# Patient Record
Sex: Female | Born: 1950 | Race: White | Hispanic: No | Smoking: Former smoker
Health system: Southern US, Community
[De-identification: ages and names within clinical notes are randomized; demographics above are authoritative.]

## PROBLEM LIST (undated history)

## (undated) DIAGNOSIS — K219 Gastro-esophageal reflux disease without esophagitis: Secondary | ICD-10-CM

## (undated) DIAGNOSIS — N3946 Mixed incontinence: Secondary | ICD-10-CM

## (undated) DIAGNOSIS — G8929 Other chronic pain: Secondary | ICD-10-CM

## (undated) DIAGNOSIS — M810 Age-related osteoporosis without current pathological fracture: Secondary | ICD-10-CM

## (undated) DIAGNOSIS — E785 Hyperlipidemia, unspecified: Secondary | ICD-10-CM

## (undated) DIAGNOSIS — K635 Polyp of colon: Secondary | ICD-10-CM

## (undated) DIAGNOSIS — M549 Dorsalgia, unspecified: Secondary | ICD-10-CM

## (undated) DIAGNOSIS — F419 Anxiety disorder, unspecified: Secondary | ICD-10-CM

## (undated) DIAGNOSIS — I10 Essential (primary) hypertension: Secondary | ICD-10-CM

## (undated) HISTORY — DX: Other chronic pain: G89.29

## (undated) HISTORY — DX: Essential (primary) hypertension: I10

## (undated) HISTORY — DX: Hyperlipidemia, unspecified: E78.5

## (undated) HISTORY — DX: Mixed incontinence: N39.46

## (undated) HISTORY — DX: Dorsalgia, unspecified: M54.9

## (undated) HISTORY — PX: OTHER SURGICAL HISTORY: SHX169

## (undated) HISTORY — DX: Polyp of colon: K63.5

## (undated) HISTORY — DX: Age-related osteoporosis without current pathological fracture: M81.0

## (undated) HISTORY — PX: FOOT SURGERY: SHX648

## (undated) HISTORY — PX: FOOT ARTHRODESIS, MODIFIED MCBRIDE: SUR52

## (undated) HISTORY — PX: BREAST BIOPSY: SHX20

## (undated) HISTORY — DX: Gastro-esophageal reflux disease without esophagitis: K21.9

## (undated) HISTORY — DX: Anxiety disorder, unspecified: F41.9

## (undated) HISTORY — PX: HYSTEROSCOPY WITH D & C: SHX1775

---

## 1999-10-24 HISTORY — PX: NISSEN FUNDOPLICATION: SHX2091

## 2000-10-23 HISTORY — PX: CHOLECYSTECTOMY: SHX55

## 2004-09-16 ENCOUNTER — Other Ambulatory Visit: Payer: Self-pay

## 2004-09-23 ENCOUNTER — Ambulatory Visit: Payer: Self-pay | Admitting: Unknown Physician Specialty

## 2004-10-09 ENCOUNTER — Emergency Department: Payer: Self-pay | Admitting: Internal Medicine

## 2004-10-23 HISTORY — PX: WRIST SURGERY: SHX841

## 2004-11-09 ENCOUNTER — Ambulatory Visit: Payer: Self-pay | Admitting: Obstetrics and Gynecology

## 2005-11-21 ENCOUNTER — Ambulatory Visit: Payer: Self-pay | Admitting: Obstetrics and Gynecology

## 2005-12-04 ENCOUNTER — Ambulatory Visit: Payer: Self-pay | Admitting: Obstetrics and Gynecology

## 2007-01-29 ENCOUNTER — Ambulatory Visit: Payer: Self-pay | Admitting: Obstetrics and Gynecology

## 2007-06-28 ENCOUNTER — Ambulatory Visit: Payer: Self-pay | Admitting: Unknown Physician Specialty

## 2008-02-03 ENCOUNTER — Ambulatory Visit: Payer: Self-pay | Admitting: Obstetrics and Gynecology

## 2009-02-09 ENCOUNTER — Ambulatory Visit: Payer: Self-pay | Admitting: Unknown Physician Specialty

## 2010-02-21 ENCOUNTER — Ambulatory Visit: Payer: Self-pay | Admitting: Internal Medicine

## 2011-03-07 ENCOUNTER — Ambulatory Visit: Payer: Self-pay | Admitting: Obstetrics and Gynecology

## 2012-01-23 LAB — HM PAP SMEAR: HM Pap smear: NORMAL

## 2012-03-07 ENCOUNTER — Ambulatory Visit: Payer: Self-pay | Admitting: Obstetrics and Gynecology

## 2012-08-13 ENCOUNTER — Ambulatory Visit: Payer: Self-pay | Admitting: Unknown Physician Specialty

## 2012-08-21 ENCOUNTER — Ambulatory Visit: Payer: Self-pay | Admitting: Unknown Physician Specialty

## 2012-08-21 LAB — HM COLONOSCOPY

## 2012-08-23 LAB — PATHOLOGY REPORT

## 2013-05-12 ENCOUNTER — Ambulatory Visit: Payer: Self-pay | Admitting: Internal Medicine

## 2014-01-05 ENCOUNTER — Ambulatory Visit: Payer: Self-pay | Admitting: Unknown Physician Specialty

## 2014-01-06 LAB — LIPID PANEL
Cholesterol: 226 mg/dL — AB (ref 0–200)
HDL: 61 mg/dL (ref 35–70)
LDL Cholesterol: 108 mg/dL
Triglycerides: 283 mg/dL — AB (ref 40–160)

## 2014-01-07 ENCOUNTER — Ambulatory Visit: Payer: Self-pay | Admitting: Orthopedic Surgery

## 2014-05-08 LAB — BASIC METABOLIC PANEL
BUN: 13 mg/dL (ref 4–21)
Creatinine: 0.6 mg/dL (ref 0.5–1.1)
Glucose: 74 mg/dL
Potassium: 4 mmol/L (ref 3.4–5.3)
Sodium: 140 mmol/L (ref 137–147)

## 2014-05-08 LAB — HEPATIC FUNCTION PANEL
ALT: 18 U/L (ref 7–35)
AST: 23 U/L (ref 13–35)
Alkaline Phosphatase: 87 U/L (ref 25–125)
Bilirubin, Total: 0.2 mg/dL

## 2014-06-10 ENCOUNTER — Ambulatory Visit: Payer: Self-pay | Admitting: Obstetrics and Gynecology

## 2014-06-10 LAB — HM MAMMOGRAPHY: HM Mammogram: NEGATIVE

## 2014-11-13 ENCOUNTER — Ambulatory Visit: Payer: Self-pay | Admitting: Nurse Practitioner

## 2014-11-23 ENCOUNTER — Encounter: Payer: Self-pay | Admitting: Nurse Practitioner

## 2014-11-23 ENCOUNTER — Ambulatory Visit (INDEPENDENT_AMBULATORY_CARE_PROVIDER_SITE_OTHER): Payer: 59 | Admitting: Nurse Practitioner

## 2014-11-23 ENCOUNTER — Encounter: Payer: Self-pay | Admitting: *Deleted

## 2014-11-23 ENCOUNTER — Encounter (INDEPENDENT_AMBULATORY_CARE_PROVIDER_SITE_OTHER): Payer: Self-pay

## 2014-11-23 VITALS — BP 156/82 | HR 81 | Temp 97.5°F | Resp 14 | Ht 60.0 in | Wt 124.4 lb

## 2014-11-23 DIAGNOSIS — Z7189 Other specified counseling: Secondary | ICD-10-CM

## 2014-11-23 DIAGNOSIS — I10 Essential (primary) hypertension: Secondary | ICD-10-CM

## 2014-11-23 DIAGNOSIS — Z7689 Persons encountering health services in other specified circumstances: Secondary | ICD-10-CM

## 2014-11-23 MED ORDER — LISINOPRIL-HYDROCHLOROTHIAZIDE 20-12.5 MG PO TABS: 1.0000 | ORAL_TABLET | Freq: Every day | ORAL | Status: DC

## 2014-11-23 NOTE — Patient Instructions (Addendum)
Stop the losartan and start the Lisinopril-HCTZ combo. Call us if it makes your BP below 100/60 or it stays above 150/90. We will follow up in 2 weeks for your BP and cholesterol.

## 2014-11-23 NOTE — Progress Notes (Signed)
Pre visit review using our clinic review tool, if applicable. No additional management support is needed unless otherwise documented below in the visit note. 

## 2014-11-23 NOTE — Progress Notes (Signed)
Subjective:    Patient ID: Margaret Fernandez, female    DOB: 12-06-1950, 64 y.o.   MRN: [id number]  HPI  Margaret Fernandez is a 64 yo female here to establish care.   1) Health Maintenance-   Diet- Cooks often, eats healthy alternatives   Exercise- Before work and sometimes after work, x 3-4 days, 30-45 min.   Immunizations- Up to date on flu, and tdap  Mammogram- 2015 April   Pap- Due this year  Bone Density- 2013   Colonoscopy- within 5 years  Eye Exam- May or June this year due  Dental Exam- Up to date   2) Chronic Problems-  HTN- Not controlled  Cholesterol- On lovastatin 40 mg  3) Acute Problems- Lost dog last year- stressful  HTN- Does not like losartan and would like a diuretic again. Also, she is concerned about her cholesterol still being high on Mevacor 40 mg.   Review of Systems  Constitutional: Negative for fever, chills, diaphoresis and fatigue.  Respiratory: Negative for chest tightness, shortness of breath and wheezing.   Cardiovascular: Negative for chest pain, palpitations and leg swelling.  Gastrointestinal: Negative for nausea, vomiting, abdominal pain, diarrhea and abdominal distention.  Skin: Negative for rash.  Neurological: Negative for dizziness, weakness, numbness and headaches.  Psychiatric/Behavioral: The patient is not nervous/anxious.    Past Medical History  Diagnosis Date  . GERD (gastroesophageal reflux disease)   . Hypertension   . Anxiety   . Hyperlipidemia   . Chronic back pain   . Osteoporosis   . Colon polyp     History   Social History  . Marital Status: Divorced    Spouse Name: N/A    Number of Children: N/A  . Years of Education: N/A   Occupational History  . Not on file.   Social History Main Topics  . Smoking status: Former Smoker    Quit date: 10/23/2013  . Smokeless tobacco: Never Used  . Alcohol Use: 0.6 oz/week    1 Glasses of wine per week  . Drug Use: No  . Sexual Activity: Not Currently   Other Topics Concern    . Not on file   Social History Narrative   Works at [employer]- Works on [employer] by herself   2 Cats live inside   [place] level of education- GED   From [place], moved here in 1971        Past Surgical History  Procedure Laterality Date  . Hysteroscopy w/d&c    . Foot surgery    . Nissen fundoplication  6546  . Cholecystectomy  2002  . Wrist surgery  2006  . Bone graph      left wrist  . Foot arthrodesis, modified mcbride      Both feet    Family History  Problem Relation Age of Onset  . Cancer Mother     ovarian cancer  . Hypertension Mother   . Heart disease Mother   . Cancer Father     colon cancer    Allergies  Allergen Reactions  . Penicillins Itching and Swelling  . Augmentin [Amoxicillin-Pot Clavulanate] Itching and Swelling    No current outpatient prescriptions on file prior to visit.   No current facility-administered medications on file prior to visit.      Objective:   Physical Exam  Constitutional: She is oriented to person, place, and time. She appears well-developed and well-nourished. No distress.  BP 156/82 mmHg  Pulse  81  Temp(Src) 97.5 F (36.4 C) (Oral)  Resp 14  Ht 5' (1.524 m)  Wt 124 lb 6.4 oz (56.427 kg)  BMI 24.30 kg/m2  SpO2 96%   HENT:  Head: Normocephalic and atraumatic.  Right Ear: External ear normal.  Left Ear: External ear normal.  Nose: Nose normal.  Mouth/Throat: Oropharynx is clear and moist. No oropharyngeal exudate.  TMs and canals clear bilaterally  Eyes: Conjunctivae and EOM are normal. Pupils are equal, round, and reactive to light. Right eye exhibits no discharge. Left eye exhibits no discharge. No scleral icterus.  Neck: Normal range of motion. Neck supple. No thyromegaly present.  Cardiovascular: Normal rate, regular rhythm, normal heart sounds and intact distal pulses.  Exam reveals no gallop and no friction rub.   No murmur heard. Pulmonary/Chest: Effort normal and breath sounds normal. No  respiratory distress. She has no wheezes. She has no rales. She exhibits no tenderness.  Abdominal: Soft. Bowel sounds are normal. She exhibits no distension and no mass. There is no tenderness. There is no rebound and no guarding.  Musculoskeletal: Normal range of motion. She exhibits no edema or tenderness.  Lymphadenopathy:    She has no cervical adenopathy.  Neurological: She is alert and oriented to person, place, and time. She has normal reflexes. No cranial nerve deficit. She exhibits normal muscle tone. Coordination normal.  Skin: Skin is warm and dry. No rash noted. She is not diaphoretic. No erythema. No pallor.  Psychiatric: She has a normal mood and affect. Her behavior is normal. Judgment and thought content normal.      Assessment & Plan:

## 2014-11-24 DIAGNOSIS — I10 Essential (primary) hypertension: Secondary | ICD-10-CM | POA: Insufficient documentation

## 2014-11-24 NOTE — Assessment & Plan Note (Signed)
Worsening. Stop losartan 50 mg and start lisinopril-HCTZ 20-12.5 mg daily. Asked to call if too low or high (gave range) and follow up in 2 weeks with numbers from home.

## 2014-11-24 NOTE — Addendum Note (Signed)
Addended by: Rubbie Battiest on: 11/24/2014 07:33 PM   Modules accepted: Level of Service

## 2014-11-24 NOTE — Assessment & Plan Note (Signed)
Reviewed health maintenance measures, PFSHx, immunizations, and chronic/acute issues. Refilled medications as needed today. No lab work was needed from [place] she states since she has it done at [facility] in 1 year or sooner if needed.

## 2014-12-07 ENCOUNTER — Ambulatory Visit: Payer: 59 | Admitting: Nurse Practitioner

## 2014-12-11 ENCOUNTER — Ambulatory Visit (INDEPENDENT_AMBULATORY_CARE_PROVIDER_SITE_OTHER): Payer: 59 | Admitting: Nurse Practitioner

## 2014-12-11 ENCOUNTER — Encounter: Payer: Self-pay | Admitting: Nurse Practitioner

## 2014-12-11 VITALS — BP 142/86 | HR 84 | Temp 97.6°F | Resp 14 | Ht 60.0 in | Wt 123.2 lb

## 2014-12-11 DIAGNOSIS — E785 Hyperlipidemia, unspecified: Secondary | ICD-10-CM

## 2014-12-11 DIAGNOSIS — I10 Essential (primary) hypertension: Secondary | ICD-10-CM

## 2014-12-11 MED ORDER — AMLODIPINE BESYLATE 5 MG PO TABS: 5.0000 mg | ORAL_TABLET | Freq: Every day | ORAL | Status: DC

## 2014-12-11 MED ORDER — LOVASTATIN 40 MG PO TABS: 40.0000 mg | ORAL_TABLET | Freq: Every day | ORAL | Status: DC

## 2014-12-11 MED ORDER — FLUOXETINE HCL 40 MG PO CAPS: 40.0000 mg | ORAL_CAPSULE | Freq: Every day | ORAL | Status: DC

## 2014-12-11 NOTE — Progress Notes (Signed)
Pre visit review using our clinic review tool, if applicable. No additional management support is needed unless otherwise documented below in the visit note. 

## 2014-12-11 NOTE — Progress Notes (Signed)
Subjective:    Patient ID: Margaret Fernandez, female    DOB: [birth year], 64 y.o.   MRN: [id number]  HPI  Margaret Fernandez is a 64 yo female following up on HTN and CC of sore throat x 1 week.    1) Takes at work and at home- Not eating salt, cutting out caffeine, walking.  19/74- 164/90 Range for 2 weeks  142/87 average  Urinating more often, less swelling Compliant with the medication and no known side effects  2) Sore throat with ear discomfort x 1 week. She was treated with doxycyline 3 weeks ago.   Review of Systems  Constitutional: Negative for fever, chills, diaphoresis and fatigue.  HENT: Positive for ear pain and sore throat.   Respiratory: Negative for chest tightness, shortness of breath and wheezing.   Cardiovascular: Negative for chest pain, palpitations and leg swelling.  Gastrointestinal: Negative for nausea, vomiting and diarrhea.  Skin: Negative for rash.  Neurological: Negative for dizziness, weakness, numbness and headaches.  Psychiatric/Behavioral: The patient is not nervous/anxious.    Past Medical History  Diagnosis Date  . GERD (gastroesophageal reflux disease)   . Hypertension   . Anxiety   . Hyperlipidemia   . Chronic back pain   . Osteoporosis   . Colon polyp     History   Social History  . Marital Status: Divorced    Spouse Name: N/A  . Number of Children: N/A  . Years of Education: N/A   Occupational History  . Not on file.   Social History Main Topics  . Smoking status: Former Smoker    Quit date: 10/23/2013  . Smokeless tobacco: Never Used  . Alcohol Use: 0.6 oz/week    1 Glasses of wine per week  . Drug Use: No  . Sexual Activity: Not Currently   Other Topics Concern  . Not on file   Social History Narrative   Works at [employer]- Works on [employer] by herself   2 Cats live inside   [place] level of education- GED   From [place], moved here in 1971        Past Surgical History  Procedure Laterality Date  .  Hysteroscopy w/d&c    . Foot surgery    . Nissen fundoplication  3545  . Cholecystectomy  2002  . Wrist surgery  2006  . Bone graph      left wrist  . Foot arthrodesis, modified mcbride      Both feet    Family History  Problem Relation Age of Onset  . Cancer Mother     ovarian cancer  . Hypertension Mother   . Heart disease Mother   . Cancer Father     colon cancer    Allergies  Allergen Reactions  . Penicillins Itching and Swelling  . Augmentin [Amoxicillin-Pot Clavulanate] Itching and Swelling    Current Outpatient Prescriptions on File Prior to Visit  Medication Sig Dispense Refill  . aspirin 81 MG tablet Take 81 mg by mouth daily.    . Calcium Carb-Cholecalciferol (CALCIUM 600 + D PO) Take 1 tablet by mouth 2 (two) times daily.    . cholecalciferol (VITAMIN D) 1000 UNITS tablet Take 1,000 Units by mouth daily.    . Lactobacillus (PROBIOTIC ACIDOPHILUS PO) Take 10 mg by mouth daily.    Marland Kitchen lisinopril-hydrochlorothiazide (PRINZIDE,ZESTORETIC) 20-12.5 MG per tablet Take 1 tablet by mouth daily. 30 tablet 0  . Multiple Vitamin (MULTIVITAMIN) tablet Take 1 tablet  by mouth daily.    . Omega-3 Fatty Acids (FISH OIL) 1000 MG CAPS Take 1 capsule by mouth daily.    Marland Kitchen omeprazole (PRILOSEC) 40 MG capsule Take 40 mg by mouth daily.    . traMADol-acetaminophen (ULTRACET) 37.5-325 MG per tablet      No current facility-administered medications on file prior to visit.      Objective:   Physical Exam  Constitutional: She is oriented to person, place, and time. She appears well-developed and well-nourished. No distress.  BP 142/86 mmHg  Pulse 84  Temp(Src) 97.6 F (36.4 C) (Oral)  Resp 14  Ht 5' (1.524 m)  Wt 123 lb 4 oz (55.906 kg)  BMI 24.07 kg/m2  SpO2 97%   HENT:  Head: Normocephalic and atraumatic.  Right Ear: External ear normal.  Left Ear: External ear normal.  TM's clear bilaterally  Eyes: Right eye exhibits no discharge. Left eye exhibits no discharge. No scleral  icterus.  Neck: No thyromegaly present.  Cardiovascular: Normal rate, regular rhythm, normal heart sounds and intact distal pulses.  Exam reveals no gallop and no friction rub.   No murmur heard. Pulmonary/Chest: Effort normal and breath sounds normal. No respiratory distress. She has no wheezes. She has no rales. She exhibits no tenderness.  Lymphadenopathy:    She has no cervical adenopathy.  Neurological: She is alert and oriented to person, place, and time. No cranial nerve deficit. She exhibits normal muscle tone. Coordination normal.  Skin: Skin is warm and dry. No rash noted. She is not diaphoretic.  Psychiatric: She has a normal mood and affect. Her behavior is normal. Judgment and thought content normal.      Assessment & Plan:

## 2014-12-11 NOTE — Patient Instructions (Signed)
Take the lovastatin twice daily.   Take the lisinopril-hctz as you have been doing.   Take the amlodipine 5 mg daily to help with your BP and keep track of it.   Follow up in 1 month.

## 2014-12-14 ENCOUNTER — Other Ambulatory Visit: Payer: Self-pay | Admitting: Nurse Practitioner

## 2014-12-14 DIAGNOSIS — E785 Hyperlipidemia, unspecified: Secondary | ICD-10-CM | POA: Insufficient documentation

## 2014-12-14 NOTE — Assessment & Plan Note (Signed)
Lipids are still uncontrolled. Will max out Lovastatin by taking it twice daily instead of once daily. FU in 1 month.

## 2014-12-14 NOTE — Assessment & Plan Note (Signed)
Will add amlodipine 5 mg daily to regimen and keep track of BP at home as she was doing previously. Will fu in 1 month.

## 2014-12-21 ENCOUNTER — Other Ambulatory Visit: Payer: Self-pay | Admitting: *Deleted

## 2014-12-21 MED ORDER — LISINOPRIL-HYDROCHLOROTHIAZIDE 20-12.5 MG PO TABS
1.0000 | ORAL_TABLET | Freq: Every day | ORAL | Status: DC
Start: 2014-12-21 — End: 2014-12-23

## 2014-12-23 ENCOUNTER — Other Ambulatory Visit: Payer: Self-pay | Admitting: Nurse Practitioner

## 2014-12-23 ENCOUNTER — Encounter: Payer: Self-pay | Admitting: Nurse Practitioner

## 2014-12-23 MED ORDER — HYDROCHLOROTHIAZIDE 12.5 MG PO TABS: 12.5000 mg | ORAL_TABLET | Freq: Every day | ORAL | Status: DC

## 2014-12-25 ENCOUNTER — Encounter: Payer: Self-pay | Admitting: Nurse Practitioner

## 2014-12-25 ENCOUNTER — Other Ambulatory Visit: Payer: Self-pay | Admitting: *Deleted

## 2014-12-25 MED ORDER — HYDROCHLOROTHIAZIDE 12.5 MG PO TABS: 12.5000 mg | ORAL_TABLET | Freq: Every day | ORAL | Status: DC

## 2015-01-08 ENCOUNTER — Encounter: Payer: Self-pay | Admitting: Nurse Practitioner

## 2015-01-08 ENCOUNTER — Ambulatory Visit (INDEPENDENT_AMBULATORY_CARE_PROVIDER_SITE_OTHER): Payer: 59 | Admitting: Nurse Practitioner

## 2015-01-08 VITALS — BP 130/80 | HR 79 | Temp 97.7°F | Resp 14 | Ht 60.0 in | Wt 122.1 lb

## 2015-01-08 DIAGNOSIS — J069 Acute upper respiratory infection, unspecified: Secondary | ICD-10-CM

## 2015-01-08 DIAGNOSIS — I10 Essential (primary) hypertension: Secondary | ICD-10-CM

## 2015-01-08 DIAGNOSIS — B9789 Other viral agents as the cause of diseases classified elsewhere: Principal | ICD-10-CM

## 2015-01-08 MED ORDER — CEPHALEXIN 500 MG PO CAPS: 500.0000 mg | ORAL_CAPSULE | Freq: Two times a day (BID) | ORAL | Status: DC

## 2015-01-08 MED ORDER — HYDROCHLOROTHIAZIDE 12.5 MG PO TABS: 12.5000 mg | ORAL_TABLET | Freq: Every day | ORAL | Status: DC

## 2015-01-08 NOTE — Patient Instructions (Signed)
Follow up in 4 weeks.   MyChart me if you need me.

## 2015-01-08 NOTE — Progress Notes (Signed)
   Subjective:    Patient ID: Jonell Cluck, female    DOB: [birth year], 64 y.o.   MRN: [id number]  HPI  Ms. Silveria is a 64 yo female here for a follow up for HTN and medication management.   1) Stopped lisinopril, started amlodipine 5 mg and hCTZ 12.5 mg   2 weeks of this change. 1 week of hoarseness. She has been having a sore throat and hoarseness for 3 weeks. She was thinking it was the lisinopril, but it is worsening and she mentioned her co-workers think she has the virus that is going around.    Review of Systems  Constitutional: Negative for fever, chills, diaphoresis and fatigue.  HENT: Positive for sore throat and voice change.   Eyes: Negative for visual disturbance.  Respiratory: Positive for cough. Negative for chest tightness, shortness of breath and wheezing.   Cardiovascular: Negative for chest pain, palpitations and leg swelling.  Gastrointestinal: Negative for nausea, vomiting and diarrhea.  Skin: Negative for rash.  Neurological: Negative for dizziness, weakness, numbness and headaches.  Psychiatric/Behavioral: The patient is not nervous/anxious.       Objective:   Physical Exam  Constitutional: She is oriented to person, place, and time. She appears well-developed and well-nourished. No distress.  BP 130/80 mmHg  Pulse 79  Temp(Src) 97.7 F (36.5 C) (Oral)  Resp 14  Ht 5' (1.524 m)  Wt 122 lb 1.9 oz (55.393 kg)  BMI 23.85 kg/m2  SpO2 98%   HENT:  Head: Normocephalic and atraumatic.  Right Ear: External ear normal.  Left Ear: External ear normal.  Mouth/Throat: No oropharyngeal exudate.  Cardiovascular: Normal rate, regular rhythm and normal heart sounds.  Exam reveals no gallop and no friction rub.   No murmur heard. Pulmonary/Chest: Effort normal and breath sounds normal. No respiratory distress. She has no wheezes. She has no rales. She exhibits no tenderness.  Neurological: She is alert and oriented to person, place, and time. No cranial nerve deficit.  She exhibits normal muscle tone. Coordination normal.  Skin: Skin is warm and dry. No rash noted. She is not diaphoretic.  Psychiatric: She has a normal mood and affect. Her behavior is normal. Judgment and thought content normal.      Assessment & Plan:

## 2015-01-08 NOTE — Progress Notes (Signed)
Pre visit review using our clinic review tool, if applicable. No additional management support is needed unless otherwise documented below in the visit note. 

## 2015-01-10 ENCOUNTER — Other Ambulatory Visit: Payer: Self-pay | Admitting: Nurse Practitioner

## 2015-01-14 DIAGNOSIS — J069 Acute upper respiratory infection, unspecified: Secondary | ICD-10-CM | POA: Insufficient documentation

## 2015-01-14 DIAGNOSIS — B9789 Other viral agents as the cause of diseases classified elsewhere: Principal | ICD-10-CM

## 2015-01-14 NOTE — Assessment & Plan Note (Signed)
Will send in Keflex 500 mg twice daily for 5 days since symptoms are worsening. Will follow up in 4 weeks or sooner if needed.

## 2015-01-14 NOTE — Assessment & Plan Note (Signed)
Stable. Continue current regimen of Norvasc 5 mg daily and HCTZ 12.5 mg daily. Follow up in 4 weeks.

## 2015-01-29 ENCOUNTER — Encounter: Payer: Self-pay | Admitting: Nurse Practitioner

## 2015-02-05 ENCOUNTER — Ambulatory Visit (INDEPENDENT_AMBULATORY_CARE_PROVIDER_SITE_OTHER): Payer: 59 | Admitting: Nurse Practitioner

## 2015-02-05 ENCOUNTER — Encounter: Payer: Self-pay | Admitting: Nurse Practitioner

## 2015-02-05 VITALS — BP 112/70 | HR 81 | Temp 97.4°F | Resp 12 | Ht 60.0 in | Wt 122.4 lb

## 2015-02-05 DIAGNOSIS — R0602 Shortness of breath: Secondary | ICD-10-CM

## 2015-02-05 DIAGNOSIS — I10 Essential (primary) hypertension: Secondary | ICD-10-CM | POA: Diagnosis not present

## 2015-02-05 MED ORDER — TRAMADOL-ACETAMINOPHEN 37.5-325 MG PO TABS: 1.0000 | ORAL_TABLET | Freq: Four times a day (QID) | ORAL | Status: DC | PRN

## 2015-02-05 NOTE — Progress Notes (Signed)
   Subjective:    Patient ID: Margaret Fernandez, female    DOB: 04/06/1951, 64 y.o.   MRN: [id number]  HPI  [name] Margaret Fernandez a 64 yo female following up for her HTN.   1) DOE, does not last very long, happens when getting up from chair to do activities. No recent EKG on file.   BP Margaret Fernandez stable.  Requests Ultram refill  Review of Systems  Constitutional: Negative for fever, chills, diaphoresis and fatigue.  Respiratory: Positive for chest tightness and shortness of breath. Negative for wheezing.   Cardiovascular: Negative for chest pain, palpitations and leg swelling.  Gastrointestinal: Negative for nausea, vomiting and diarrhea.  Psychiatric/Behavioral: The patient Margaret Fernandez not nervous/anxious.       Objective:   Physical Exam  Constitutional: She Margaret Fernandez oriented to person, place, and time. She appears well-developed and well-nourished. No distress.  BP 112/70 mmHg  Pulse 81  Temp(Src) 97.4 F (36.3 C) (Oral)  Resp 12  Ht 5' (1.524 m)  Wt 122 lb 6.4 oz (55.52 kg)  BMI 23.90 kg/m2  SpO2 98%   HENT:  Head: Normocephalic and atraumatic.  Right Ear: External ear normal.  Left Ear: External ear normal.  Cardiovascular: Normal rate, regular rhythm, normal heart sounds and intact distal pulses.  Exam reveals no gallop and no friction rub.   No murmur heard. Pulmonary/Chest: Effort normal and breath sounds normal. No respiratory distress. She has no wheezes. She has no rales. She exhibits no tenderness.  Neurological: She Margaret Fernandez alert and oriented to person, place, and time. No cranial nerve deficit. She exhibits normal muscle tone. Coordination normal.  Skin: Skin Margaret Fernandez warm and dry. No rash noted. She Margaret Fernandez not diaphoretic.  Psychiatric: She has a normal mood and affect. Her behavior Margaret Fernandez normal. Judgment and thought content normal.       Assessment & Plan:

## 2015-02-05 NOTE — Patient Instructions (Signed)
Follow up in 2 months- sooner if you are still having Shortness of breath or lasts more than a minute.   Hydrate with water!

## 2015-02-05 NOTE — Progress Notes (Signed)
Pre visit review using our clinic review tool, if applicable. No additional management support is needed unless otherwise documented below in the visit note. 

## 2015-02-08 ENCOUNTER — Other Ambulatory Visit: Payer: Self-pay | Admitting: *Deleted

## 2015-02-08 MED ORDER — OMEPRAZOLE 40 MG PO CPDR: 40.0000 mg | DELAYED_RELEASE_CAPSULE | Freq: Every day | ORAL | Status: DC

## 2015-02-13 NOTE — Op Note (Signed)
PATIENT NAME:  Margaret Fernandez, Margaret Fernandez MR#:  [id number] DATE OF BIRTH:  1951-07-27  DATE OF PROCEDURE:  01/07/2014  PREOPERATIVE DIAGNOSIS:  Right de Quervain's stenosing tenosynovitis.  POSTOPERATIVE DIAGNOSIS:  Right de Quervain's stenosing tenosynovitis.   PROCEDURE:  Harriet Pho release right wrist.  ANESTHESIA:  General.   SURGEON:  Hessie Knows, M.D.   DESCRIPTION OF PROCEDURE:  The patient was brought to the Operating Room and after adequate anesthesia was obtained, the arm was prepped and draped in the usual sterile fashion with a tourniquet applied to the upper arm.  After appropriate patient identification and timeout procedures were completed the tourniquet was raised to 250 mmHg.  A small incision was made over the radial styloid over a palpable mass.  The subcutaneous tissue was carefully separated protecting cutaneous nerves.  There was a very thick band whic was released.  The multiple slips of tendons were released with some obvious abrasions from wear, but no significant tendon loss.  The wounds were then irrigated and then closed with simple interrupted 5-0 nylon skin suture; 10 mL of 0.5% Sensorcaine without epinephrine was infiltrated proximal to the incision for postop analgesia.  Sterile dressing of Xeroform, 4 x 4, Webril and Ace wrap applied and the patient was sent to the recovery room in stable condition.  Tourniquet time was 9 minutes.   COMPLICATIONS:  There were no complications.   SPECIMEN:  No specimen.     ____________________________ Laurene Footman, MD mjm:ea D: 01/07/2014 21:15:56 ET T: 01/08/2014 04:11:10 ET JOB#: [id number]  cc: Laurene Footman, MD, <Dictator> Laurene Footman MD ELECTRONICALLY SIGNED 01/08/2014 7:43

## 2015-02-16 DIAGNOSIS — R0602 Shortness of breath: Secondary | ICD-10-CM | POA: Insufficient documentation

## 2015-02-16 NOTE — Assessment & Plan Note (Addendum)
EKG was normal today. FU in 2 months. Asked to RTC sooner if still not improving. Stay hydrated. BP excellent today.

## 2015-02-16 NOTE — Assessment & Plan Note (Signed)
Ms. Feliz is recovering from recent URI. May be residual symptoms. EKG normal today. RTC if no improvement.

## 2015-03-04 ENCOUNTER — Telehealth: Payer: Self-pay

## 2015-03-04 NOTE — Telephone Encounter (Signed)
Attempted to call patient regarding prescription that was faxed from pharmacy this am.  Left a VM to return call.

## 2015-03-04 NOTE — Telephone Encounter (Signed)
Spoke  With Patient she states that she has been on Amitriptyline for the past 10 years and that her gyn. Doctor prescribed it for her.  She sees him once a year and will see him in the next month, but needs the script refilled.  She said you had talked with her about this, but I again can't find it on her list of medications present or historical?  Please advise.  She said if there are any problems we can call her.  Thanks

## 2015-03-04 NOTE — Telephone Encounter (Signed)
Okay to fill, let me see the request so I can look at dosage. It was never placed on her list due to not having the dosage (it rings a bell now that we talked about it- I think she mentioned it to me as she was leaving her last appointment). Thanks!

## 2015-03-04 NOTE — Telephone Encounter (Signed)
Unsure of why it is coming to [place]... I agree- cannot find that we prescribed this. Ask pt if there is another Dr. Prescribing this or was this something we discussed. I don't see any info in my notes. Thanks!

## 2015-03-04 NOTE — Telephone Encounter (Signed)
Medication Request from Pharmacy for Amitriptyline tab.  Patient is currently not prescribed this medication, but is on Prozac.  Last OV was 4.15.16.  Please advise?

## 2015-03-05 ENCOUNTER — Telehealth: Payer: Self-pay

## 2015-03-05 ENCOUNTER — Other Ambulatory Visit: Payer: Self-pay | Admitting: Nurse Practitioner

## 2015-03-05 MED ORDER — AMITRIPTYLINE HCL 50 MG PO TABS: 50.0000 mg | ORAL_TABLET | Freq: Every day | ORAL | Status: DC

## 2015-03-05 NOTE — Telephone Encounter (Signed)
Spoke with patient , her dosage is Amitriptyline 50mg  Daily at HS.  Please advise.  Thanks

## 2015-03-05 NOTE — Telephone Encounter (Signed)
Sent to pharmacy- mail in [facility]. Thanks!

## 2015-03-18 ENCOUNTER — Telehealth: Payer: Self-pay | Admitting: Nurse Practitioner

## 2015-03-18 NOTE — Telephone Encounter (Signed)
Pt called with concerns about medication affecting her throat and knot has come up. Best contact # [phone] 03/18/15 maf

## 2015-03-23 NOTE — Telephone Encounter (Signed)
No answer. Left vm to return my call.

## 2015-03-23 NOTE — Telephone Encounter (Signed)
Please find out about what she is talking about thanks!

## 2015-03-23 NOTE — Telephone Encounter (Signed)
Pt states she feels a free moving mass on the back of her neck.  Denies pain. Will wait to be seen at upcoming appointment 04/09/15.  Encouraged patient to call back if there are any changes or concerns.

## 2015-03-25 DIAGNOSIS — K2270 Barrett's esophagus without dysplasia: Secondary | ICD-10-CM | POA: Insufficient documentation

## 2015-04-09 ENCOUNTER — Encounter: Payer: Self-pay | Admitting: Nurse Practitioner

## 2015-04-09 ENCOUNTER — Ambulatory Visit (INDEPENDENT_AMBULATORY_CARE_PROVIDER_SITE_OTHER): Payer: 59 | Admitting: Nurse Practitioner

## 2015-04-09 VITALS — BP 120/70 | HR 89 | Temp 98.0°F | Resp 14 | Ht 60.0 in | Wt 119.8 lb

## 2015-04-09 DIAGNOSIS — Z1239 Encounter for other screening for malignant neoplasm of breast: Secondary | ICD-10-CM

## 2015-04-09 DIAGNOSIS — M542 Cervicalgia: Secondary | ICD-10-CM | POA: Diagnosis not present

## 2015-04-09 DIAGNOSIS — I10 Essential (primary) hypertension: Secondary | ICD-10-CM

## 2015-04-09 DIAGNOSIS — Z13820 Encounter for screening for osteoporosis: Secondary | ICD-10-CM | POA: Diagnosis not present

## 2015-04-09 NOTE — Progress Notes (Signed)
Pre visit review using our clinic review tool, if applicable. No additional management support is needed unless otherwise documented below in the visit note. 

## 2015-04-09 NOTE — Patient Instructions (Signed)
[facility] [place], [place] [address] Get Driving Directions Hours of Operation Monday - Friday, 8 a.m. - 5 p.m. Main: [phone]

## 2015-04-09 NOTE — Progress Notes (Signed)
   Subjective:    Patient ID: Margaret Fernandez, female    DOB: 01/11/1951, 64 y.o.   MRN: [id number]  HPI  Margaret Fernandez is a 64 yo female following up on her HTN and has a mass on her neck x 1 month.  1) Pt brought BP readings which were all 145/90 and below. Due to being so close to range, discussed options going forward.   2) Neck mass- occasional left shoulder numbness, midline spine area above C7- pt feels with extension and not with flexion of neck. She denies pain, tenderness, color change, or growth.   3) Endoscopy scheduled in July Colonoscopy 2013, due 2018  Mammogram- [facility] 1 7/13 and 06/10/14 Bone Density- 04/2012 normal  PaP 01/23/12- normal, June 29th next appointment  Dr. Gloriann Loan 04/13/14  Review of Systems  Constitutional: Negative for fever, chills, diaphoresis and fatigue.  Respiratory: Negative for chest tightness, shortness of breath and wheezing.   Cardiovascular: Negative for chest pain, palpitations and leg swelling.  Gastrointestinal: Negative for nausea, vomiting and diarrhea.  Skin: Negative for rash.  Neurological: Positive for numbness. Negative for dizziness, weakness and headaches.       Left shoulder occasionally  Psychiatric/Behavioral: The patient is not nervous/anxious.       Objective:   Physical Exam  Constitutional: She is oriented to person, place, and time. She appears well-developed and well-nourished. No distress.  BP 120/70 mmHg  Pulse 89  Temp(Src) 98 F (36.7 C) (Oral)  Resp 14  Ht 5' (1.524 m)  Wt 119 lb 12.8 oz (54.341 kg)  BMI 23.40 kg/m2  SpO2 98%   HENT:  Head: Normocephalic and atraumatic.  Right Ear: External ear normal.  Left Ear: External ear normal.  Cardiovascular: Normal rate, regular rhythm, normal heart sounds and intact distal pulses.  Exam reveals no gallop and no friction rub.   No murmur heard. Pulmonary/Chest: Effort normal and breath sounds normal. No respiratory distress. She has no wheezes. She has no rales. She  exhibits no tenderness.  Neurological: She is alert and oriented to person, place, and time. No cranial nerve deficit. She exhibits normal muscle tone. Coordination normal.  Skin: Skin is warm and dry. No rash noted. She is not diaphoretic.  Psychiatric: She has a normal mood and affect. Her behavior is normal. Judgment and thought content normal.      Assessment & Plan:

## 2015-04-11 DIAGNOSIS — Z13820 Encounter for screening for osteoporosis: Secondary | ICD-10-CM | POA: Insufficient documentation

## 2015-04-11 DIAGNOSIS — Z1239 Encounter for other screening for malignant neoplasm of breast: Secondary | ICD-10-CM | POA: Insufficient documentation

## 2015-04-11 DIAGNOSIS — M542 Cervicalgia: Secondary | ICD-10-CM | POA: Insufficient documentation

## 2015-04-11 NOTE — Assessment & Plan Note (Signed)
Bone density scan referral placed.

## 2015-04-11 NOTE — Assessment & Plan Note (Signed)
Will not change medication at this time due to average normal numbers.   BP Readings from Last 3 Encounters:  04/09/15 120/70  02/05/15 112/70  01/08/15 130/80   Lab Results  Component Value Date   CREATININE 0.6 05/08/2014

## 2015-04-11 NOTE — Assessment & Plan Note (Signed)
Mass on neck felt like normal vertebrae, but with more give than usual. Will x-ray cervical spine to find out more. Will follow after

## 2015-04-11 NOTE — Assessment & Plan Note (Signed)
Mammogram referral placed.

## 2015-04-20 ENCOUNTER — Encounter: Payer: Self-pay | Admitting: *Deleted

## 2015-04-20 ENCOUNTER — Other Ambulatory Visit: Payer: Self-pay | Admitting: Nurse Practitioner

## 2015-04-20 LAB — HM DEXA SCAN

## 2015-04-21 ENCOUNTER — Encounter: Payer: Self-pay | Admitting: Obstetrics and Gynecology

## 2015-04-21 ENCOUNTER — Ambulatory Visit (INDEPENDENT_AMBULATORY_CARE_PROVIDER_SITE_OTHER): Payer: 59 | Admitting: Obstetrics and Gynecology

## 2015-04-21 VITALS — BP 134/81 | HR 93 | Ht 59.0 in | Wt 119.6 lb

## 2015-04-21 DIAGNOSIS — Z780 Asymptomatic menopausal state: Secondary | ICD-10-CM

## 2015-04-21 DIAGNOSIS — Z01419 Encounter for gynecological examination (general) (routine) without abnormal findings: Secondary | ICD-10-CM

## 2015-04-21 DIAGNOSIS — Z0000 Encounter for general adult medical examination without abnormal findings: Secondary | ICD-10-CM | POA: Diagnosis not present

## 2015-04-21 DIAGNOSIS — Z1211 Encounter for screening for malignant neoplasm of colon: Secondary | ICD-10-CM | POA: Diagnosis not present

## 2015-04-21 NOTE — Progress Notes (Signed)
Patient ID: Margaret Fernandez, female   DOB: Dec 12, 1950, 64 y.o.   MRN: [id number]   ANNUAL PREVENTATIVE CARE GYN  ENCOUNTER NOTE  Subjective:       Margaret Fernandez is a 64 y.o. [phone] female here for a routine annual gynecologic exam.  Current complaints: 1.  Menopause; no HRT; no abnormal uterine bleeding; no symptoms 2.  Review dexa   Gynecologic History No LMP recorded. Patient is postmenopausal. Contraception: post menopausal status Last Pap: 01/23/2012. Results were: normal Last mammogram: 06/11/2014 birad 1. Results were: normal  Obstetric History OB History  Gravida Para Term Preterm AB SAB TAB Ectopic Multiple Living  3 3 3       3     # Outcome Date GA Lbr Len/2nd Weight Sex Delivery Anes PTL Lv  3 Term 1977   7 lb 8 oz (3.402 kg) F Vag-Spont   Y  2 Term 1974   8 lb (3.629 kg) F Vag-Spont   Y  1 Term 1972   6 lb 8 oz (2.948 kg) M Vag-Spont   Y      Past Medical History  Diagnosis Date  . GERD (gastroesophageal reflux disease)   . Hypertension   . Anxiety   . Hyperlipidemia   . Chronic back pain   . Osteoporosis   . Colon polyp   . Mixed incontinence     Past Surgical History  Procedure Laterality Date  . Hysteroscopy w/d&c    . Foot surgery    . Nissen fundoplication  5027  . Cholecystectomy  2002  . Wrist surgery  2006  . Bone graph      left wrist  . Foot arthrodesis, modified mcbride      Both feet    Current Outpatient Prescriptions on File Prior to Visit  Medication Sig Dispense Refill  . amitriptyline (ELAVIL) 50 MG tablet Take 1 tablet (50 mg total) by mouth at bedtime. 30 tablet 3  . amLODipine (NORVASC) 5 MG tablet Take 1 tablet by mouth  daily 62 tablet 3  . aspirin 81 MG tablet Take 81 mg by mouth daily.    . Calcium Carb-Cholecalciferol (CALCIUM 600 + D PO) Take 1 tablet by mouth 2 (two) times daily.    . cholecalciferol (VITAMIN D) 1000 UNITS tablet Take 1,000 Units by mouth daily.    Marland Kitchen FLUoxetine (PROZAC) 40 MG capsule Take 1 capsule (40  mg total) by mouth daily. 90 capsule 3  . hydrochlorothiazide (HYDRODIURIL) 12.5 MG tablet Take 1 tablet (12.5 mg total) by mouth daily. 30 tablet 11  . Lactobacillus (PROBIOTIC ACIDOPHILUS PO) Take 10 mg by mouth daily.    Marland Kitchen lovastatin (MEVACOR) 40 MG tablet Take 1 tablet by mouth  twice a day 180 tablet 1  . Multiple Vitamin (MULTIVITAMIN) tablet Take 1 tablet by mouth daily.    . Omega-3 Fatty Acids (FISH OIL) 1000 MG CAPS Take 1 capsule by mouth daily.    Marland Kitchen omeprazole (PRILOSEC) 40 MG capsule Take 1 capsule (40 mg total) by mouth daily. 90 capsule 1  . traMADol-acetaminophen (ULTRACET) 37.5-325 MG per tablet Take 1 tablet by mouth every 6 (six) hours as needed. 60 tablet 0   No current facility-administered medications on file prior to visit.    Allergies  Allergen Reactions  . Penicillins Itching and Swelling  . Augmentin [Amoxicillin-Pot Clavulanate] Itching and Swelling    History   Social History  . Marital Status: Divorced    Spouse Name: N/A  .  Number of Children: N/A  . Years of Education: N/A   Occupational History  . Not on file.   Social History Main Topics  . Smoking status: Former Smoker    Quit date: 10/23/2013  . Smokeless tobacco: Never Used  . Alcohol Use: 0.6 oz/week    1 Glasses of wine per week  . Drug Use: No  . Sexual Activity: Not Currently   Other Topics Concern  . Not on file   Social History Narrative   Works at [employer]- Works on [employer] by herself   2 Cats live inside   [employer] level of education- GED   From [place], moved here in [place] History  Problem Relation Age of Onset  . Cancer Mother     ovarian cancer  . Hypertension Mother   . Heart disease Mother   . Cancer Father     colon cancer  . Diabetes Neg Hx     The following portions of the patient's history were reviewed and updated as appropriate: allergies, current medications, past family history, past medical history, past social history, past  surgical history and problem list.  Review of Systems ROS Review of Systems - General ROS: negative for - chills, fatigue, fever, hot flashes, night sweats, weight gain or weight loss Psychological ROS: negative for - anxiety, decreased libido, depression, mood swings, physical abuse or sexual abuse Ophthalmic ROS: negative for - blurry vision, eye pain or loss of vision ENT ROS: negative for - headaches, hearing change, visual changes or vocal changes Allergy and Immunology ROS: negative for - hives, itchy/watery eyes or seasonal allergies Hematological and Lymphatic ROS: negative for - bleeding problems, bruising, swollen lymph nodes or weight loss Endocrine ROS: negative for - galactorrhea, hair pattern changes, hot flashes, malaise/lethargy, mood swings, palpitations, polydipsia/polyuria, skin changes, temperature intolerance or unexpected weight changes Breast ROS: negative for - new or changing breast lumps or nipple discharge Respiratory ROS: negative for - cough or shortness of breath Cardiovascular ROS: negative for - chest pain, irregular heartbeat, palpitations or shortness of breath Gastrointestinal ROS: no abdominal pain, change in bowel habits, or black or bloody stools Genito-Urinary ROS: no dysuria, trouble voiding, or hematuria Musculoskeletal ROS: negative for - joint pain or joint stiffness Neurological ROS: negative for - bowel and bladder control changes Dermatological ROS: negative for rash and skin lesion changes   Objective:   BP 134/81 mmHg  Pulse 93  Ht 4\' 11"  (1.499 m)  Wt 119 lb 9.6 oz (54.25 kg)  BMI 24.14 kg/m2 CONSTITUTIONAL: Well-developed, well-nourished female in no acute distress.  PSYCHIATRIC: Normal mood and affect. Normal behavior. Normal judgment and thought content. [place]: Alert and oriented to person, place, and time. Normal muscle tone coordination. No cranial nerve deficit noted. HENT:  Normocephalic, atraumatic, External right and left ear  normal. Oropharynx is clear and moist EYES: Conjunctivae and EOM are normal. Pupils are equal, round, and reactive to light. No scleral icterus.  NECK: Normal range of motion, supple, no masses.  Normal thyroid.  SKIN: Skin is warm and dry. No rash noted. Not diaphoretic. No erythema. No pallor. CARDIOVASCULAR: Normal heart rate noted, regular rhythm, no murmur. RESPIRATORY: Clear to auscultation bilaterally. Effort and breath sounds normal, no problems with respiration noted. BREASTS: Symmetric in size. No masses, skin changes, nipple drainage, or lymphadenopathy. ABDOMEN: Soft, normal bowel sounds, no distention noted.  No tenderness, rebound or guarding.  BLADDER: Normal PELVIC:  External Genitalia: Normal  BUS: Normal  Vagina: Normal  Cervix: Normal  Uterus: Normal   Adnexa: Normal  RV: External Exam NormaI, No Rectal Masses and Normal Sphincter tone  MUSCULOSKELETAL: Normal range of motion. No tenderness.  No cyanosis, clubbing, or edema.  2+ distal pulses. LYMPHATIC: No Axillary, Supraclavicular, or Inguinal Adenopathy.    Assessment:   Annual gynecologic examination 64 y.o. Contraception: post menopausal status Normal bmi  Problem List Items Addressed This Visit    None      Plan:  Pap:Co-test Mammogram: ordered Stool Guaiac Testing:  Ordered Labs: per pcp Routine preventative health maintenance measures emphasized: Exercise/Diet/Weight control, Tobacco Warnings, Alcohol/Substance use risks and Stress Management  Return to Clinic - 1 [address] [place], CMA   Brayton Mars, MD

## 2015-04-21 NOTE — Addendum Note (Signed)
Addended by: Elouise Munroe on: 04/21/2015 02:10 PM   Modules accepted: Orders

## 2015-04-22 ENCOUNTER — Telehealth: Payer: Self-pay

## 2015-04-22 NOTE — Telephone Encounter (Signed)
LMTCB

## 2015-04-22 NOTE — Telephone Encounter (Signed)
Informed pt of xray results, pt verbalized understanding.

## 2015-04-22 NOTE — Telephone Encounter (Signed)
-----   Message from Rubbie Battiest, NP sent at 04/22/2015  7:21 AM EDT ----- Please let Ms. Borre know that her x-ray of her neck shows degenerative disease of the spine. There were no soft tissue findings (aka the lump she felt), and her bone density scan shows osteopenia, which is the precursor to osteoporosis. Continue to take the calcium and vitamin D combo and exercising (walking is great). Thanks, [employer]

## 2015-04-23 LAB — PAP IG AND HPV HIGH-RISK
HPV, high-risk: NEGATIVE
PAP Smear Comment: 0

## 2015-04-30 ENCOUNTER — Encounter: Payer: Self-pay | Admitting: Nurse Practitioner

## 2015-05-05 LAB — FECAL OCCULT BLOOD, IMMUNOCHEMICAL: Fecal Occult Bld: NEGATIVE

## 2015-05-10 ENCOUNTER — Other Ambulatory Visit: Payer: Self-pay | Admitting: Nurse Practitioner

## 2015-05-10 ENCOUNTER — Encounter: Payer: Self-pay | Admitting: Nurse Practitioner

## 2015-05-10 DIAGNOSIS — D170 Benign lipomatous neoplasm of skin and subcutaneous tissue of head, face and neck: Secondary | ICD-10-CM

## 2015-05-10 DIAGNOSIS — E781 Pure hyperglyceridemia: Secondary | ICD-10-CM

## 2015-05-20 ENCOUNTER — Telehealth: Payer: Self-pay | Admitting: Nurse Practitioner

## 2015-05-20 NOTE — Telephone Encounter (Signed)
Pt request to have lab work done prior to next visit (cholesterol, triglycerides, and hemoglobin). Pt wants to have labs done at [facility] and request the requisition to be mailed to her/msn

## 2015-05-24 NOTE — Telephone Encounter (Signed)
Please mail completed form to patient. Thanks!

## 2015-06-04 ENCOUNTER — Ambulatory Visit: Payer: 59 | Admitting: Family Medicine

## 2015-06-07 ENCOUNTER — Ambulatory Visit (INDEPENDENT_AMBULATORY_CARE_PROVIDER_SITE_OTHER): Payer: 59 | Admitting: Family Medicine

## 2015-06-07 ENCOUNTER — Telehealth: Payer: Self-pay

## 2015-06-07 ENCOUNTER — Encounter: Payer: Self-pay | Admitting: Family Medicine

## 2015-06-07 VITALS — BP 116/60 | HR 98 | Temp 98.5°F | Ht 59.0 in | Wt 126.0 lb

## 2015-06-07 DIAGNOSIS — S2241XD Multiple fractures of ribs, right side, subsequent encounter for fracture with routine healing: Secondary | ICD-10-CM | POA: Diagnosis not present

## 2015-06-07 DIAGNOSIS — S2249XA Multiple fractures of ribs, unspecified side, initial encounter for closed fracture: Secondary | ICD-10-CM | POA: Insufficient documentation

## 2015-06-07 MED ORDER — HYDROCODONE-ACETAMINOPHEN 5-325 MG PO TABS: 1.0000 | ORAL_TABLET | Freq: Three times a day (TID) | ORAL | Status: DC | PRN

## 2015-06-07 MED ORDER — MELOXICAM 15 MG PO TABS: 15.0000 mg | ORAL_TABLET | Freq: Every day | ORAL | Status: DC | PRN

## 2015-06-07 NOTE — Progress Notes (Signed)
Pre visit review using our clinic review tool, if applicable. No additional management support is needed unless otherwise documented below in the visit note. 

## 2015-06-07 NOTE — Patient Instructions (Signed)
It was nice to see you today.  Use the medication as needed.  Call us if you feel like you can't go back to work in ~ 1 week.   Take care  Dr. Lacinda Axon

## 2015-06-07 NOTE — Telephone Encounter (Addendum)
Pt gave me verbal permission to fax her work her a letter for when she is able to return to work. The letter was faxed to Ronnell Freshwater at [phone].

## 2015-06-07 NOTE — Assessment & Plan Note (Signed)
New problem, subsequent encounter. Patient with 3 rib fractures. Currently out of Tramadol. Treating pain with Hydrocodone & Mobic. Rx's given today. Work note and paperwork filled out taking patient out of work this week.

## 2015-06-07 NOTE — Progress Notes (Signed)
Subjective:  Patient ID: Margaret Fernandez, female    DOB: 1951/05/29  Age: 64 y.o. MRN: [id number]  CC: Follow up rib fracture  HPI  64 year old female presents to the clinic today for follow-up regarding recent rib fractures.  Patient reports that on Saturday, August 6th she was walking her dog and suffered a fall. She injured her right arm and forearm and her right lower ribs. She subsequently developed shortness of breath and went to a local urgent care for evaluation.  Patient was seen and evaluated and chest x-ray revealed nondisplaced fractures of the anterior medial right fifth, sixth, and seventh ribs. X-rays of her arm were negative.  Patient presents today for follow-up. She continues to have severe pain of her ribs particularly with breathing. She has been taking tramadol and more recently ibuprofen with some relief in her discomfort. No other relieving factors. Additionally, she reports swelling and mild pain of her right forearm. Patient reports that she is out of her current medications and is requesting pain medication today. No reported fevers, chills, cough.  Social Hx - Former smoker.  Review of Systems  Constitutional: Negative for fever and chills.  Respiratory: Positive for cough and shortness of breath.     Objective:  BP 116/60 mmHg  Pulse 98  Temp(Src) 98.5 F (36.9 C) (Oral)  Ht 4\' 11"  (1.499 m)  Wt 126 lb (57.153 kg)  BMI 25.44 kg/m2  SpO2 95%  BP/Weight 06/07/2015 04/21/2015 4/00/8676  Systolic BP 195 093 267  Diastolic BP 60 81 70  Wt. (Lbs) 126 119.6 119.8  BMI 25.44 24.14 23.4     Physical Exam  Constitutional: She is oriented to person, place, and time. She appears well-developed and well-nourished. No distress.  Cardiovascular: Normal rate and regular rhythm.   Pulmonary/Chest: Effort normal and breath sounds normal. No respiratory distress. She has no wheezes. She has no rales.  Patient with R mid-lower rib tenderness to palpation. Bruising  noted.  Abdominal: Soft. She exhibits no distension. There is no tenderness.  Musculoskeletal:  Swelling and abrasions noted - R forearm. No fracture noted. Good ROM.  Neurological: She is alert and oriented to person, place, and time.  Psychiatric: She has a normal mood and affect.    Lab Results  Component Value Date   CHOL 226* 01/06/2014   TRIG 283* 01/06/2014   HDL 61 01/06/2014   LDLCALC 108 01/06/2014   ALT 18 05/08/2014   AST 23 05/08/2014   NA 140 05/08/2014   K 4.0 05/08/2014   CREATININE 0.6 05/08/2014   BUN 13 05/08/2014    Assessment & Plan:   Problem List Items Addressed This Visit    Multiple rib fractures - Primary    New problem, subsequent encounter. Patient with 3 rib fractures. Currently out of Tramadol. Treating pain with Hydrocodone & Mobic. Rx's given today. Work note and paperwork filled out taking patient out of work this week.         Meds ordered this encounter  Medications  . meloxicam (MOBIC) 15 MG tablet    Sig: Take 1 tablet (15 mg total) by mouth daily as needed for pain.    Dispense:  20 tablet    Refill:  0  . HYDROcodone-acetaminophen (NORCO/VICODIN) 5-325 MG per tablet    Sig: Take 1-2 tablets by mouth every 8 (eight) hours as needed for moderate pain.    Dispense:  60 tablet    Refill:  0     Follow-up: Return if  symptoms worsen or fail to improve.    Thersa Salt, DO

## 2015-06-08 ENCOUNTER — Other Ambulatory Visit: Payer: Self-pay | Admitting: Nurse Practitioner

## 2015-06-10 LAB — LIPID PANEL
Cholesterol: 206 mg/dL — AB (ref 0–200)
HDL: 65 mg/dL (ref 35–70)
LDL Cholesterol: 70 mg/dL
Triglycerides: 354 mg/dL — AB (ref 40–160)

## 2015-06-10 LAB — HEMOGLOBIN A1C: Hgb A1c MFr Bld: 5.8 % (ref 4.0–6.0)

## 2015-06-10 LAB — BASIC METABOLIC PANEL: Glucose: 95 mg/dL

## 2015-06-11 ENCOUNTER — Telehealth: Payer: Self-pay

## 2015-06-11 NOTE — Telephone Encounter (Addendum)
Pt left message with triage to fax another out of work letter to a Ms.Kim with [employer] which is her disability people [phone]. the fax was sent per patient request.

## 2015-06-11 NOTE — Telephone Encounter (Signed)
Patient called and stated that she had an appt with Dr. Lacinda Axon earlier this week. Patient said that she has a question, and is requesting a call from Dr. Lacinda Axon.

## 2015-06-11 NOTE — Telephone Encounter (Addendum)
Pt called about getting a work note for her to be out until 06/21/15. Dr.Cook confirmed that it was okay to write that note. Pt gave me verbal that i can fax to her supervisor Debra blackmon.

## 2015-06-14 ENCOUNTER — Other Ambulatory Visit: Payer: 59

## 2015-06-14 ENCOUNTER — Ambulatory Visit: Payer: 59

## 2015-07-16 ENCOUNTER — Encounter: Payer: Self-pay | Admitting: Nurse Practitioner

## 2015-07-16 ENCOUNTER — Ambulatory Visit (INDEPENDENT_AMBULATORY_CARE_PROVIDER_SITE_OTHER): Payer: 59 | Admitting: Nurse Practitioner

## 2015-07-16 VITALS — BP 108/60 | HR 86 | Temp 98.1°F | Resp 16 | Ht 59.0 in | Wt 121.6 lb

## 2015-07-16 DIAGNOSIS — R0602 Shortness of breath: Secondary | ICD-10-CM | POA: Diagnosis not present

## 2015-07-16 DIAGNOSIS — R7309 Other abnormal glucose: Secondary | ICD-10-CM | POA: Diagnosis not present

## 2015-07-16 DIAGNOSIS — I10 Essential (primary) hypertension: Secondary | ICD-10-CM

## 2015-07-16 DIAGNOSIS — S2241XD Multiple fractures of ribs, right side, subsequent encounter for fracture with routine healing: Secondary | ICD-10-CM

## 2015-07-16 DIAGNOSIS — R7303 Prediabetes: Secondary | ICD-10-CM

## 2015-07-16 MED ORDER — TRAMADOL-ACETAMINOPHEN 37.5-325 MG PO TABS: 1.0000 | ORAL_TABLET | Freq: Four times a day (QID) | ORAL | Status: DC | PRN

## 2015-07-16 MED ORDER — BUPROPION HCL ER (XL) 150 MG PO TB24: 150.0000 mg | ORAL_TABLET | Freq: Every day | ORAL | Status: DC

## 2015-07-16 NOTE — Patient Instructions (Addendum)
Prozac try half tablet (20 mg) for 7 days.  Then do prozac 20 mg (half tablet) every other day for 1 week.  On the second week (day 8-14) take wellbutrin 150 mg once daily in the morning.   Follow up in 1 month.

## 2015-07-16 NOTE — Assessment & Plan Note (Signed)
5.8 A1c in August. Pt is doing a program through work to improve this and her triglycerides.

## 2015-07-16 NOTE — Assessment & Plan Note (Signed)
BP Readings from Last 3 Encounters:  07/16/15 108/60  06/07/15 116/60  04/21/15 134/81   Stable! Pt is happy with this. Will follow

## 2015-07-16 NOTE — Assessment & Plan Note (Signed)
Resolved since BP is stable

## 2015-07-16 NOTE — Progress Notes (Signed)
Patient ID: Margaret Fernandez, female    DOB: [birth year]  Age: 64 y.o. MRN: [id number]  CC: Follow-up   HPI Margaret Fernandez presents for HTN and rib pain.   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 Doing a Live active program at work. Working on diet and exercise.  2) Indecisiveness and crying easily. Wants to try a new SSRI. Feels burning sensation with taking it. Tried lexapro, zoloft, wellbutrin. Would like to try Wellbutrin again.   3) Tramadol last taken beginning of August. Took half of hydrocodone this morning and wants to switch to intermittent Tramadol.   History Margaret Fernandez has a past medical history of GERD (gastroesophageal reflux disease); Hypertension; Anxiety; Hyperlipidemia; Chronic back pain; Osteoporosis; Colon polyp; and Mixed incontinence.   She has past surgical history that includes Hysteroscopy w/D&C; Foot surgery; Nissen fundoplication (9326); Cholecystectomy (2002); Wrist surgery (2006); bone graph; and Foot arthrodesis, modified Mcbride.   Her family history includes Cancer in her father and mother; Heart disease in her mother; Hypertension in her mother. There is no history of Diabetes.She reports that she quit smoking about 20 months ago. She has never used smokeless tobacco. She reports that she drinks about 0.6 oz of alcohol per week. She reports that she does not use illicit drugs.  Outpatient Prescriptions Prior to Visit  Medication Sig Dispense Refill  . amitriptyline (ELAVIL) 50 MG tablet Take 1 tablet (50 mg total) by mouth at bedtime. 30 tablet 3  . amLODipine (NORVASC) 5 MG tablet Take 1 tablet by mouth  daily 90 tablet 2  . aspirin 81 MG tablet Take 81 mg by mouth daily.    . Calcium Carb-Cholecalciferol (CALCIUM 600 + D PO) Take 1 tablet by mouth 2 (two) times daily.    . cholecalciferol (VITAMIN D) 1000 UNITS tablet Take 1,000 Units by mouth daily.    Marland Kitchen FLUoxetine (PROZAC) 40 MG capsule Take 1 capsule (40 mg total) by mouth daily. 90 capsule 3  . Lactobacillus  (PROBIOTIC ACIDOPHILUS PO) Take 10 mg by mouth daily.    Marland Kitchen lovastatin (MEVACOR) 40 MG tablet Take 1 tablet by mouth  twice a day 180 tablet 1  . Multiple Vitamin (MULTIVITAMIN) tablet Take 1 tablet by mouth daily.    . Omega-3 Fatty Acids (FISH OIL) 1000 MG CAPS Take 1 capsule by mouth daily.    Marland Kitchen omeprazole (PRILOSEC) 40 MG capsule Take 1 capsule (40 mg total) by mouth daily. 90 capsule 1  . HYDROcodone-acetaminophen (NORCO/VICODIN) 5-325 MG per tablet Take 1-2 tablets by mouth every 8 (eight) hours as needed for moderate pain. 60 tablet 0  . traMADol-acetaminophen (ULTRACET) 37.5-325 MG per tablet Take 1 tablet by mouth every 6 (six) hours as needed. 60 tablet 0  . hydrochlorothiazide (HYDRODIURIL) 12.5 MG tablet Take 1 tablet (12.5 mg total) by mouth daily. (Patient not taking: Reported on 07/16/2015) 30 tablet 11  . meloxicam (MOBIC) 15 MG tablet Take 1 tablet (15 mg total) by mouth daily as needed for pain. (Patient not taking: Reported on 07/16/2015) 20 tablet 0   No facility-administered medications prior to visit.    ROS Review of Systems  Constitutional: Negative for fever, chills, diaphoresis and fatigue.  Respiratory: Negative for chest tightness, shortness of breath and wheezing.   Cardiovascular: Negative for chest pain, palpitations and leg swelling.  Gastrointestinal: Negative for nausea, vomiting and diarrhea.  Musculoskeletal: Positive for arthralgias.  Skin: Negative for rash.  Neurological: Negative for dizziness, weakness, numbness and headaches.  Psychiatric/Behavioral: The  patient is not nervous/anxious.     Objective:  BP 108/60 mmHg  Pulse 86  Temp(Src) 98.1 F (36.7 C)  Resp 16  Ht 4\' 11"  (1.499 m)  Wt 121 lb 9.6 oz (55.157 kg)  BMI 24.55 kg/m2  SpO2 99%  Physical Exam  Constitutional: She is oriented to person, place, and time. She appears well-developed and well-nourished. No distress.  HENT:  Head: Normocephalic and atraumatic.  Right Ear: External  ear normal.  Left Ear: External ear normal.  Cardiovascular: Normal rate, regular rhythm and normal heart sounds.  Exam reveals no gallop and no friction rub.   No murmur heard. Pulmonary/Chest: Effort normal and breath sounds normal. No respiratory distress. She has no wheezes. She has no rales. She exhibits no tenderness.  Neurological: She is alert and oriented to person, place, and time. No cranial nerve deficit. She exhibits normal muscle tone. Coordination normal.  Skin: Skin is warm and dry. No rash noted. She is not diaphoretic.  Psychiatric: She has a normal mood and affect. Her behavior is normal. Judgment and thought content normal.   Assessment & Plan:   Margaret Fernandez was seen today for follow-up.  Diagnoses and all orders for this visit:  Multiple rib fractures, right, with routine healing, subsequent encounter  SOB (shortness of breath) on exertion  Essential hypertension  Pre-diabetes  Other orders -     traMADol-acetaminophen (ULTRACET) 37.5-325 MG per tablet; Take 1 tablet by mouth every 6 (six) hours as needed. -     buPROPion (WELLBUTRIN XL) 150 MG 24 hr tablet; Take 1 tablet (150 mg total) by mouth daily.  I have discontinued Margaret Fernandez's HYDROcodone-acetaminophen. I am also having her start on buPROPion. Additionally, I am having her maintain her Lactobacillus (PROBIOTIC ACIDOPHILUS PO), aspirin, Calcium Carb-Cholecalciferol (CALCIUM 600 + D PO), Fish Oil, multivitamin, cholecalciferol, FLUoxetine, hydrochlorothiazide, omeprazole, amitriptyline, lovastatin, meloxicam, amLODipine, and traMADol-acetaminophen.  Meds ordered this encounter  Medications  . traMADol-acetaminophen (ULTRACET) 37.5-325 MG per tablet    Sig: Take 1 tablet by mouth every 6 (six) hours as needed.    Dispense:  60 tablet    Refill:  0    Order Specific Question:  Supervising Provider    Answer:  Deborra Medina L [[id number]]  . buPROPion (WELLBUTRIN XL) 150 MG 24 hr tablet    Sig: Take 1 tablet (150 mg  total) by mouth daily.    Dispense:  30 tablet    Refill:  0    Order Specific Question:  Supervising Provider    Answer:  Crecencio Mc [[id number]]     Follow-up: Return in about 4 weeks (around 08/13/2015) for Follow up in 1 month .

## 2015-07-16 NOTE — Assessment & Plan Note (Signed)
Improving, but still having pain with some movement. Pt willing to stop Vicodin and switch back to Ultracet. UDS given today. 1 month of Ultracet sent to [facility]. [place] checked for compliance. Will follow

## 2015-07-23 ENCOUNTER — Other Ambulatory Visit: Payer: Self-pay | Admitting: Nurse Practitioner

## 2015-08-09 ENCOUNTER — Ambulatory Visit
Admission: RE | Admit: 2015-08-09 | Discharge: 2015-08-09 | Disposition: A | Discharge status: Home / Self Care | Payer: 59 | Source: Ambulatory Visit | Attending: Nurse Practitioner | Admitting: Nurse Practitioner

## 2015-08-09 DIAGNOSIS — Z1231 Encounter for screening mammogram for malignant neoplasm of breast: Secondary | ICD-10-CM | POA: Insufficient documentation

## 2015-08-09 DIAGNOSIS — Z1239 Encounter for other screening for malignant neoplasm of breast: Secondary | ICD-10-CM

## 2015-08-17 ENCOUNTER — Other Ambulatory Visit: Payer: Self-pay

## 2015-08-17 ENCOUNTER — Telehealth: Payer: Self-pay | Admitting: *Deleted

## 2015-08-17 MED ORDER — BUPROPION HCL ER (XL) 150 MG PO TB24: ORAL_TABLET | ORAL | Status: DC

## 2015-08-17 NOTE — Telephone Encounter (Signed)
I have completed her request.

## 2015-08-17 NOTE — Telephone Encounter (Signed)
Patient requested a medication refill for bupropion-thanks

## 2015-09-06 ENCOUNTER — Encounter (INDEPENDENT_AMBULATORY_CARE_PROVIDER_SITE_OTHER): Payer: Self-pay

## 2015-09-06 ENCOUNTER — Ambulatory Visit (INDEPENDENT_AMBULATORY_CARE_PROVIDER_SITE_OTHER)
Admission: RE | Admit: 2015-09-06 | Discharge: 2015-09-06 | Disposition: A | Discharge status: Home / Self Care | Payer: 59 | Source: Ambulatory Visit | Attending: Family Medicine | Admitting: Family Medicine

## 2015-09-06 ENCOUNTER — Telehealth: Payer: Self-pay | Admitting: Nurse Practitioner

## 2015-09-06 ENCOUNTER — Encounter: Payer: Self-pay | Admitting: Family Medicine

## 2015-09-06 ENCOUNTER — Ambulatory Visit (INDEPENDENT_AMBULATORY_CARE_PROVIDER_SITE_OTHER): Payer: 59 | Admitting: Family Medicine

## 2015-09-06 VITALS — BP 110/70 | HR 83 | Temp 97.7°F | Resp 18 | Ht 59.0 in | Wt 125.0 lb

## 2015-09-06 DIAGNOSIS — M20011 Mallet finger of right finger(s): Secondary | ICD-10-CM

## 2015-09-06 DIAGNOSIS — J209 Acute bronchitis, unspecified: Secondary | ICD-10-CM

## 2015-09-06 DIAGNOSIS — M20019 Mallet finger of unspecified finger(s): Secondary | ICD-10-CM | POA: Insufficient documentation

## 2015-09-06 MED ORDER — HYDROCODONE-HOMATROPINE 5-1.5 MG/5ML PO SYRP: 5.0000 mL | ORAL_SOLUTION | Freq: Three times a day (TID) | ORAL | Status: DC | PRN

## 2015-09-06 MED ORDER — DOXYCYCLINE HYCLATE 100 MG PO TABS: 100.0000 mg | ORAL_TABLET | Freq: Two times a day (BID) | ORAL | Status: DC

## 2015-09-06 MED ORDER — BENZONATATE 100 MG PO CAPS: 100.0000 mg | ORAL_CAPSULE | Freq: Three times a day (TID) | ORAL | Status: DC | PRN

## 2015-09-06 NOTE — Patient Instructions (Signed)
Be sure to get the xray.  Go to a medical supply store for a splint for the finger.  Take the antibiotic as prescribed. Use the Tessalon for the cough during the day. Use Hycodan at night.  Follow up regarding the finger in ~ 4 weeks.  Take care  Dr.  Lacinda Axon

## 2015-09-06 NOTE — Progress Notes (Signed)
Pre-visit discussion using our clinic review tool. No additional management support is needed unless otherwise documented below in the visit note.  

## 2015-09-06 NOTE — Assessment & Plan Note (Signed)
Treating with Tessalon, Hycodan and Doxycycline.

## 2015-09-06 NOTE — Assessment & Plan Note (Signed)
History/mechanism of injury and exam consistent with mallet finger. Obtaining xray to ensure no underlying fracture. Rx given for DIP splint (in extension). Advised that she will need to wear it for 6 weeks or more. 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 She states that she will think about whether she wants to pursue splinting or whether she will just live with it.

## 2015-09-06 NOTE — Telephone Encounter (Signed)
Patient Name: Margaret Fernandez  DOB: [birth year]    Initial Comment Caller States she has had a cough for almost 2 weeks   Nurse Assessment      Guidelines    Guideline Title Affirmed Question Affirmed Notes       Final Disposition User   FINAL ATTEMPT MADE - message left [place], Therapist, sports, Vanita Ingles

## 2015-09-06 NOTE — Progress Notes (Signed)
Subjective:  Patient ID: Margaret Fernandez, female    DOB: [birth year]  Age: 64 y.o. MRN: [id number]  CC: Cough, finger injury.   HPI:  64 year old female presents to clinic today for an acute visit with complaints of cough. Additionally, she reports a recent finger injury.  Cough  Patient reports a mildly productive cough.  She states it is been going on since last Saturday.  No fevers or chills. No shortness of breath.  She's been taking Mucinex with some relief.  However, cough continues to persist and is interrupting her daily life actively at work.  No known exacerbating factors.  Finger injury  Patient states that approximately 3 weeks ago she injured her right ring finger on a window.  She states that she hit her finger directly on the window "jamming it". Injury is at the DIP joint.   She reports that it is mildly painful particularly with palpation.  She is unable to extend her finger at the DIP joint.  She did not get seen at the time of injury.  No exacerbating or relieving factors. No interventions tried.  Social Hx   Social History   Social History  . Marital Status: Divorced    Spouse Name: N/A  . Number of Children: N/A  . Years of Education: N/A   Social History Main Topics  . Smoking status: Former Smoker    Quit date: 10/23/2013  . Smokeless tobacco: Never Used  . Alcohol Use: 0.6 oz/week    1 Glasses of wine per week  . Drug Use: No  . Sexual Activity: Not Currently   Other Topics Concern  . None   Social History Narrative   Works at [employer]- Works on [employer] by herself   2 Cats live inside   [place] level of education- GED   From [place], moved here in [place]  Constitutional: Negative for fever and chills.  Respiratory: Positive for cough. Negative for shortness of breath.    Objective:  BP 110/70 mmHg  Pulse 83  Temp(Src) 97.7 F (36.5 C) (Oral)  Resp 18  Ht 4\' 11"  (1.499 m)  Wt 125 lb  (56.7 kg)  BMI 25.23 kg/m2  SpO2 96%  BP/Weight 09/06/2015 07/16/2015 [id number]  Systolic BP A999333 [id number]  Diastolic BP 70 60 60  Wt. (Lbs) 125 121.6 126  BMI 25.23 24.55 25.44    Physical Exam  Constitutional: She appears well-developed and well-nourished. No distress.  Cardiovascular: Normal rate and regular rhythm.   Pulmonary/Chest: Effort normal. No respiratory distress. She has no wheezes. She has no rales.  Musculoskeletal:  Right 4th digit (ring finger) - Tip of finger in flexion. Unable to actively extend at DIP joint. Mild swelling of DIP joint.   Neurological: She is alert.  Psychiatric: She has a normal mood and affect.  Vitals reviewed.  Lab Results  Component Value Date   CHOL 206* 06/10/2015   TRIG 354* 06/10/2015   HDL 65 06/10/2015   LDLCALC 70 06/10/2015   ALT 18 05/08/2014   AST 23 05/08/2014   NA 140 05/08/2014   K 4.0 05/08/2014   CREATININE 0.6 05/08/2014   BUN 13 05/08/2014   HGBA1C 5.8 06/10/2015    Assessment & Plan:   Problem List Items Addressed This Visit    Mallet finger    History/mechanism of injury and exam consistent with mallet finger. Obtaining xray to ensure no underlying  fracture. Rx given for DIP splint (in extension). Advised that she will need to wear it for 6 weeks or more. 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 She states that she will think about whether she wants to pursue splinting or whether she will just live with it.       Acute bronchitis    Treating with Tessalon, Hycodan and Doxycycline.        Other Visit Diagnoses    Mallet finger of right hand    -  Primary    Relevant Orders    DG Finger Ring Right       Meds ordered this encounter  Medications  . doxycycline (VIBRA-TABS) 100 MG tablet    Sig: Take 1 tablet (100 mg total) by mouth 2 (two) times daily.    Dispense:  20 tablet    Refill:  0  . HYDROcodone-homatropine (HYCODAN) 5-1.5 MG/5ML syrup    Sig: Take 5  mLs by mouth every 8 (eight) hours as needed for cough.    Dispense:  120 mL    Refill:  0  . benzonatate (TESSALON) 100 MG capsule    Sig: Take 1 capsule (100 mg total) by mouth 3 (three) times daily as needed.    Dispense:  30 capsule    Refill:  0    Follow-up: 4 weeks regarding mallet finger.   Thersa Salt, DO

## 2015-09-07 ENCOUNTER — Telehealth: Payer: Self-pay | Admitting: *Deleted

## 2015-09-07 ENCOUNTER — Other Ambulatory Visit: Payer: Self-pay | Admitting: Family Medicine

## 2015-09-07 ENCOUNTER — Other Ambulatory Visit: Payer: Self-pay

## 2015-09-07 ENCOUNTER — Ambulatory Visit: Payer: 59 | Admitting: Nurse Practitioner

## 2015-09-07 ENCOUNTER — Other Ambulatory Visit: Payer: Self-pay | Admitting: Nurse Practitioner

## 2015-09-07 DIAGNOSIS — S62639A Displaced fracture of distal phalanx of unspecified finger, initial encounter for closed fracture: Secondary | ICD-10-CM

## 2015-09-07 MED ORDER — BENZONATATE 100 MG PO CAPS: 100.0000 mg | ORAL_CAPSULE | Freq: Three times a day (TID) | ORAL | Status: DC | PRN

## 2015-09-07 MED ORDER — HYDROCODONE-HOMATROPINE 5-1.5 MG/5ML PO SYRP
5.0000 mL | ORAL_SOLUTION | Freq: Three times a day (TID) | ORAL | Status: DC | PRN
Start: 2015-09-07 — End: 2016-04-05

## 2015-09-07 MED ORDER — DOXYCYCLINE HYCLATE 100 MG PO TABS: 100.0000 mg | ORAL_TABLET | Freq: Two times a day (BID) | ORAL | Status: DC

## 2015-09-07 NOTE — Telephone Encounter (Signed)
Resent prescriptions as they were sent to [facility].

## 2015-09-07 NOTE — Telephone Encounter (Signed)
Patient stated that her medication for her antibiotic and cough medication was sent to the wrong pharmacy, her Rx needed to sent to [facility], the original Rx should be cancelled so that [facility] will fill the Rx.

## 2015-09-07 NOTE — Telephone Encounter (Signed)
Pt called about her medication of benzonatate (TESSALON) 100 MG capsule and doxycycline (VIBRA-TABS) 100 MG tablet . Pt states that the pharmacy did not have the prescriptions. Pharmacy is [facility] on [address]. Thank You!

## 2015-09-07 NOTE — Telephone Encounter (Signed)
Spoke with patient concerning medicine being called into the right pharmacy.

## 2015-09-24 ENCOUNTER — Other Ambulatory Visit: Payer: Self-pay

## 2015-09-24 MED ORDER — HYDROCHLOROTHIAZIDE 12.5 MG PO TABS: 12.5000 mg | ORAL_TABLET | Freq: Every day | ORAL | Status: DC

## 2015-09-24 NOTE — Telephone Encounter (Signed)
Resent prescription as it needed to have Dr. Deborra Medina, NPI and address on it per the [facility]

## 2015-10-01 ENCOUNTER — Telehealth: Payer: Self-pay | Admitting: *Deleted

## 2015-10-01 NOTE — Telephone Encounter (Signed)
Patient requested a call, a call for consultation for her back brace. Please Advise

## 2015-10-01 NOTE — Telephone Encounter (Signed)
Attempted to call the patient, not able to leave a message.  I will try again.

## 2015-10-01 NOTE — Telephone Encounter (Signed)
Please advise 

## 2015-10-01 NOTE — Telephone Encounter (Signed)
Very confused. Back brace? Does she think she needs one or has one? Find out what the issue is please.

## 2015-10-01 NOTE — Telephone Encounter (Signed)
Clarification.  Patient is requesting a finger brace.  She saw Dr. Lacinda Axon and was referred to a surgeon and now she is requesting where can she go to get a brace.

## 2015-10-04 NOTE — Telephone Encounter (Signed)
Attempted to call patient, not able to leave a message.

## 2015-10-04 NOTE — Telephone Encounter (Signed)
Pt was given an Rx for a splint of the finger. She will take this to any medical equipment place. [facility] is my recommendation to try first.

## 2015-10-29 ENCOUNTER — Other Ambulatory Visit: Payer: Self-pay

## 2015-10-29 NOTE — Telephone Encounter (Signed)
Please advise 

## 2015-11-01 MED ORDER — TRAMADOL-ACETAMINOPHEN 37.5-325 MG PO TABS: 1.0000 | ORAL_TABLET | Freq: Four times a day (QID) | ORAL | Status: DC | PRN

## 2015-11-02 ENCOUNTER — Other Ambulatory Visit: Payer: Self-pay | Admitting: Nurse Practitioner

## 2015-11-04 ENCOUNTER — Other Ambulatory Visit: Payer: Self-pay | Admitting: Nurse Practitioner

## 2015-11-04 DIAGNOSIS — Z760 Encounter for issue of repeat prescription: Secondary | ICD-10-CM

## 2015-11-25 ENCOUNTER — Other Ambulatory Visit: Payer: Self-pay | Admitting: Nurse Practitioner

## 2015-12-04 ENCOUNTER — Other Ambulatory Visit: Payer: Self-pay | Admitting: Nurse Practitioner

## 2016-02-09 ENCOUNTER — Other Ambulatory Visit: Payer: Self-pay | Admitting: Nurse Practitioner

## 2016-02-16 ENCOUNTER — Other Ambulatory Visit: Payer: Self-pay | Admitting: Nurse Practitioner

## 2016-02-21 ENCOUNTER — Other Ambulatory Visit: Payer: Self-pay

## 2016-02-21 MED ORDER — OMEPRAZOLE 40 MG PO CPDR: DELAYED_RELEASE_CAPSULE | ORAL | Status: DC

## 2016-02-29 ENCOUNTER — Other Ambulatory Visit: Payer: Self-pay | Admitting: Nurse Practitioner

## 2016-03-21 ENCOUNTER — Other Ambulatory Visit: Payer: Self-pay | Admitting: Family Medicine

## 2016-03-21 MED ORDER — BUPROPION HCL ER (XL) 150 MG PO TB24: ORAL_TABLET | ORAL | Status: DC

## 2016-03-21 NOTE — Telephone Encounter (Signed)
Last refilled 02/09/16 and last seen 08/2015. Please advise?

## 2016-03-23 ENCOUNTER — Telehealth: Payer: Self-pay | Admitting: *Deleted

## 2016-03-23 NOTE — Telephone Encounter (Signed)
Patient has requested a medication refill for tramadol Pharmacy [facility]

## 2016-03-23 NOTE — Telephone Encounter (Signed)
Tried to call patient to inquire why she needs more Tramadol but there was no answer. Will  Rout to Dr. Jonathon Jordan nurse

## 2016-03-24 NOTE — Telephone Encounter (Signed)
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 This all was from about ten years ago. Patient stated she is unable to drive due to her leg pain. She will be seeing neurology soon to have this matter fixed. She just needs medication until then.

## 2016-03-24 NOTE — Telephone Encounter (Signed)
Was unable to reach patient.

## 2016-03-26 NOTE — Telephone Encounter (Signed)
Will need to be seen

## 2016-03-27 ENCOUNTER — Telehealth: Payer: Self-pay | Admitting: *Deleted

## 2016-03-27 NOTE — Telephone Encounter (Signed)
Tried calling patient but there was no answer and no voicemail.

## 2016-03-29 NOTE — Telephone Encounter (Signed)
Pt called back and she is sch on 06/14 @ 10:45.    Call pt @ [phone]. Thank you!

## 2016-03-29 NOTE — Telephone Encounter (Signed)
If patient calls back please have her schedule a appointment with Dr.Cook.

## 2016-03-29 NOTE — Telephone Encounter (Signed)
Patient was unable to be reached. Let her know the appointment is to talk about the medication she was requesting a refill on. See previous messages.

## 2016-03-29 NOTE — Telephone Encounter (Signed)
LVTCB

## 2016-03-29 NOTE — Telephone Encounter (Signed)
Left a voicemail to call office

## 2016-04-05 ENCOUNTER — Ambulatory Visit (INDEPENDENT_AMBULATORY_CARE_PROVIDER_SITE_OTHER): Payer: 59 | Admitting: Family Medicine

## 2016-04-05 ENCOUNTER — Encounter: Payer: Self-pay | Admitting: Family Medicine

## 2016-04-05 VITALS — BP 106/80 | HR 83 | Temp 98.1°F | Ht 59.0 in | Wt 114.5 lb

## 2016-04-05 DIAGNOSIS — Z0001 Encounter for general adult medical examination with abnormal findings: Secondary | ICD-10-CM

## 2016-04-05 DIAGNOSIS — G8929 Other chronic pain: Secondary | ICD-10-CM | POA: Diagnosis not present

## 2016-04-05 DIAGNOSIS — F419 Anxiety disorder, unspecified: Secondary | ICD-10-CM | POA: Diagnosis not present

## 2016-04-05 DIAGNOSIS — M545 Low back pain, unspecified: Secondary | ICD-10-CM | POA: Insufficient documentation

## 2016-04-05 MED ORDER — BUPROPION HCL ER (XL) 300 MG PO TB24: ORAL_TABLET | ORAL | Status: DC

## 2016-04-05 MED ORDER — ZOSTER VACCINE LIVE 19400 UNT/0.65ML ~~LOC~~ SUSR: 0.6500 mL | Freq: Once | SUBCUTANEOUS | Status: DC

## 2016-04-05 MED ORDER — TRAMADOL-ACETAMINOPHEN 37.5-325 MG PO TABS: 1.0000 | ORAL_TABLET | Freq: Three times a day (TID) | ORAL | Status: DC | PRN

## 2016-04-05 NOTE — Progress Notes (Signed)
Pre visit review using our clinic review tool, if applicable. No additional management support is needed unless otherwise documented below in the visit note. 

## 2016-04-05 NOTE — Assessment & Plan Note (Signed)
Worsening.  Increased Wellbutrin to 300 mg daily.

## 2016-04-05 NOTE — Assessment & Plan Note (Addendum)
New problem. Has exacerbations periodically. Continue Ibuprofen as needed.  Refilled Ultracet.

## 2016-04-05 NOTE — Patient Instructions (Signed)
Continue your meds. I increased the wellbutrin.   Follow up in 6 months.  Take care  Dr. Lacinda Axon

## 2016-04-05 NOTE — Assessment & Plan Note (Signed)
Pap smear up-to-date. Mammogram up-to-date. She is due for this later this year. Colonoscopy up-to-date. Immunizations up-to-date excluding Zostavax. Rx sent to pharmacy. Labs today - [facility].

## 2016-04-05 NOTE — Progress Notes (Signed)
Subjective:  Patient ID: Margaret Fernandez, female    DOB: [birth year]  Age: 65 y.o. MRN: [id number]  CC: Annual exam  HPI:  65 year old female with HTN, Prediabetes, HLD, chronic low back pain presents for annual exam. Additional issues below.  Preventative Healthcare  Pap smear: Up to date.   Mammogram: Due for mammogram later this year.  Colonoscopy: Up-to-date.  Immunizations  Tetanus - up-to-date.  Pneumococcal - up-to-date.  Zoster - In need of.  Labs: Labs today - via [facility].  Alcohol use: See below.  Smoking/tobacco use: Former smoker.  Regular dental exams: Yes.   Wears seat belt: Yes.   Low back pain  Patient reports chronic low back pain at the level of L4/5.  Pain is fairly well controlled but intermittently has exacerbations which require Ultracet.  Managed currently with PRN Ibuprofen and occasional Ultracet.  Requesting refill on Ultracet.  Anxiety/stress  Recent increased anxiety secondary to stress at work.  She has been taking Wellbutrin for quite some time.  Like to discuss increasing therapy today.  Social Hx  Social History   Social History  . Marital Status: Divorced    Spouse Name: N/A  . Number of Children: N/A  . Years of Education: N/A   Social History Main Topics  . Smoking status: Former Smoker    Quit date: 10/23/2013  . Smokeless tobacco: Never Used  . Alcohol Use: 0.6 oz/week    1 Glasses of wine per week  . Drug Use: No  . Sexual Activity: Not Currently   Other Topics Concern  . None   Social History Narrative   Works at [employer]- Works on [employer] by herself   2 Cats live inside   [place] level of education- GED   From [place], moved here in [place]: Positive for back pain.  All other systems reviewed and are negative.  Objective:  BP 106/80 mmHg  Pulse 83  Temp(Src) 98.1 F (36.7 C) (Oral)  Ht 4\' 11"  (1.499 m)  Wt 114 lb 8 oz (51.937 kg)  BMI  23.11 kg/m2  SpO2 96%  BP/Weight 04/05/2016 09/06/2015 [id number]  Systolic BP A999333 A999333 [id number]  Diastolic BP 80 70 60  Wt. (Lbs) 114.5 125 121.6  BMI 23.11 25.23 24.55   Physical Exam  Constitutional: She is oriented to person, place, and time. She appears well-developed. No distress.  HENT:  Head: Normocephalic and atraumatic.  Mouth/Throat: Oropharynx is clear and moist.  Eyes: Conjunctivae are normal.  Neck: Neck supple.  Cardiovascular: Normal rate and regular rhythm.   Pulmonary/Chest: Effort normal and breath sounds normal. She has no wheezes. She has no rales.  Abdominal: Soft. There is no tenderness. There is no rebound and no guarding.  Musculoskeletal: Normal range of motion.  Neurological: She is alert and oriented to person, place, and time.  Skin: Skin is warm and dry. No rash noted.  Psychiatric: She has a normal mood and affect.  Vitals reviewed.  Lab Results  Component Value Date   CHOL 206* 06/10/2015   TRIG 354* 06/10/2015   HDL 65 06/10/2015   LDLCALC 70 06/10/2015   ALT 18 05/08/2014   AST 23 05/08/2014   NA 140 05/08/2014   K 4.0 05/08/2014   CREATININE 0.6 05/08/2014   BUN 13 05/08/2014   HGBA1C 5.8 06/10/2015   Assessment & Plan:   Problem List Items Addressed This Visit  Anxiety    Worsening.  Increased Wellbutrin to 300 mg daily.       Relevant Medications   buPROPion (WELLBUTRIN XL) 300 MG 24 hr tablet   Chronic low back pain    New problem. Has exacerbations periodically. Continue Ibuprofen as needed.  Refilled Ultracet.       Relevant Medications   traMADol-acetaminophen (ULTRACET) 37.5-325 MG tablet   Encounter for preventative adult health care exam with abnormal findings - Primary    Pap smear up-to-date. Mammogram up-to-date. She is due for this later this year. Colonoscopy up-to-date. Immunizations up-to-date excluding Zostavax. Rx sent to pharmacy. Labs today - [facility].         Meds ordered this encounter  Medications   . buPROPion (WELLBUTRIN XL) 300 MG 24 hr tablet    Sig: Take 1 tablet by mouth  daily    Dispense:  90 tablet    Refill:  1  . traMADol-acetaminophen (ULTRACET) 37.5-325 MG tablet    Sig: Take 1 tablet by mouth every 8 (eight) hours as needed.    Dispense:  60 tablet    Refill:  0  . Zoster Vaccine Live, PF, (ZOSTAVAX) 91478 UNT/0.65ML injection    Sig: Inject 19,400 Units into the skin once.    Dispense:  1 each    Refill:  0   Follow-up: 6 months.  [facility]

## 2016-04-10 ENCOUNTER — Ambulatory Visit: Payer: 59 | Admitting: Family Medicine

## 2016-04-18 ENCOUNTER — Telehealth: Payer: Self-pay | Admitting: Family Medicine

## 2016-04-18 NOTE — Telephone Encounter (Signed)
Margaret Fernandez called from [place] needing the Dx code for the medication Zoster Vaccine Live, PF, (ZOSTAVAX) 29562 UNT/0.65ML injection. Thank you!

## 2016-04-18 NOTE — Telephone Encounter (Signed)
Spoke with the pharmacy, gave ICD 10. thanks

## 2016-05-02 ENCOUNTER — Other Ambulatory Visit: Payer: Self-pay | Admitting: Family Medicine

## 2016-05-03 LAB — LIPID PANEL W/O CHOL/HDL RATIO
Cholesterol, Total: 252 mg/dL — ABNORMAL HIGH (ref 100–199)
HDL: 57 mg/dL (ref >39)
Triglycerides: 965 mg/dL (ref 0–149)

## 2016-05-03 LAB — COMPREHENSIVE METABOLIC PANEL
ALT: 27 IU/L (ref 0–32)
AST: 32 IU/L (ref 0–40)
Albumin/Globulin Ratio: 1.8 (ref 1.2–2.2)
Albumin: 4.1 g/dL (ref 3.6–4.8)
Alkaline Phosphatase: 91 IU/L (ref 39–117)
BUN/Creatinine Ratio: 21 (ref 12–28)
BUN: 10 mg/dL (ref 8–27)
Bilirubin Total: 0.2 mg/dL (ref 0.0–1.2)
CO2: 25 mmol/L (ref 18–29)
Calcium: 9.5 mg/dL (ref 8.7–10.3)
Chloride: 98 mmol/L (ref 96–106)
Creatinine, Ser: 0.47 mg/dL — ABNORMAL LOW (ref 0.57–1.00)
GFR calc Af Amer: 121 mL/min/{1.73_m2} (ref >59)
GFR calc non Af Amer: 105 mL/min/{1.73_m2} (ref >59)
Globulin, Total: 2.3 g/dL (ref 1.5–4.5)
Glucose: 83 mg/dL (ref 65–99)
Potassium: 3.8 mmol/L (ref 3.5–5.2)
Sodium: 141 mmol/L (ref 134–144)
Total Protein: 6.4 g/dL (ref 6.0–8.5)

## 2016-05-03 LAB — CBC
Hematocrit: 37.9 % (ref 34.0–46.6)
Hemoglobin: 12.8 g/dL (ref 11.1–15.9)
MCH: 31 pg (ref 26.6–33.0)
MCHC: 33.8 g/dL (ref 31.5–35.7)
MCV: 92 fL (ref 79–97)
Platelets: 231 10*3/uL (ref 150–379)
RBC: 4.13 x10E6/uL (ref 3.77–5.28)
RDW: 12.6 % (ref 12.3–15.4)
WBC: 4.9 10*3/uL (ref 3.4–10.8)

## 2016-05-03 LAB — HGB A1C W/O EAG: Hgb A1c MFr Bld: 5.3 % (ref 4.8–5.6)

## 2016-05-12 ENCOUNTER — Telehealth: Payer: Self-pay | Admitting: Family Medicine

## 2016-05-12 NOTE — Telephone Encounter (Signed)
Pt called needing to get lab results from 07/05 at [facility]. Pt would like it to be mailed.   [address] [place] [address]   Call pt @ [phone]. Thank you!

## 2016-05-12 NOTE — Telephone Encounter (Signed)
Mail labs as requested by patient. Called to inform no answer.

## 2016-05-17 ENCOUNTER — Other Ambulatory Visit: Payer: Self-pay | Admitting: Nurse Practitioner

## 2016-05-18 NOTE — Telephone Encounter (Signed)
Pt requesting refill on Lovastatin. Reviewed pt's latest lipid panel. Saw triglycerides were quite elevated. You mentioned repeating fasting lipid panel in your note. Did you want repeat fasting lipid panel done before appt in December to make sure we don't need a dose change? Please advise. Thank you.

## 2016-05-19 ENCOUNTER — Other Ambulatory Visit: Payer: Self-pay | Admitting: Family Medicine

## 2016-05-19 ENCOUNTER — Telehealth: Payer: Self-pay | Admitting: Family Medicine

## 2016-05-19 DIAGNOSIS — E785 Hyperlipidemia, unspecified: Secondary | ICD-10-CM

## 2016-05-19 NOTE — Telephone Encounter (Signed)
Order placed. Return for fasting lipid.

## 2016-05-19 NOTE — Telephone Encounter (Signed)
Patient had labs drawn on 05/02/16. Recommendations on fasting lab orders for this patient.

## 2016-05-19 NOTE — Telephone Encounter (Signed)
Pt called about getting a letter to get Fasting labs ordered. Need orders please and thank you! Pt will be going to [place].

## 2016-05-22 NOTE — Telephone Encounter (Signed)
LAbs ordered, thanks

## 2016-05-22 NOTE — Telephone Encounter (Signed)
Ok. I called pt no vm came on. Thank you!

## 2016-05-31 ENCOUNTER — Telehealth: Payer: Self-pay | Admitting: Family Medicine

## 2016-05-31 NOTE — Telephone Encounter (Signed)
Pt called about her lab order not able to be seen by [facility]. Pt wants to know if it can be faxed to [phone]. Thank you!

## 2016-05-31 NOTE — Telephone Encounter (Signed)
Order changed.  Printed order and faxed to [employer]

## 2016-06-15 ENCOUNTER — Other Ambulatory Visit: Payer: Self-pay | Admitting: Family Medicine

## 2016-06-16 LAB — LIPID PANEL
Chol/HDL Ratio: 3.5 ratio units (ref 0.0–4.4)
Cholesterol, Total: 215 mg/dL — ABNORMAL HIGH (ref 100–199)
HDL: 61 mg/dL (ref >39)
Triglycerides: 521 mg/dL — ABNORMAL HIGH (ref 0–149)

## 2016-06-28 ENCOUNTER — Telehealth: Payer: Self-pay | Admitting: Family Medicine

## 2016-06-28 ENCOUNTER — Other Ambulatory Visit: Payer: Self-pay | Admitting: Family Medicine

## 2016-06-28 MED ORDER — FENOFIBRATE 145 MG PO TABS: 145.0000 mg | ORAL_TABLET | Freq: Every day | ORAL | 3 refills | Status: DC

## 2016-06-28 NOTE — Telephone Encounter (Signed)
The patient is concerned about her triglycerides being so high. She is wanting to know what steps should be taken to bring it down.

## 2016-06-28 NOTE — Telephone Encounter (Signed)
Spoken to patient and have informed her medication was [facility] for high Trig.

## 2016-06-28 NOTE — Telephone Encounter (Signed)
Please advise 

## 2016-06-28 NOTE — Telephone Encounter (Signed)
Pt called about having a concern regarding her triglycerides number was 521. Please advise?  Call pt @ [phone]. Thank you!

## 2016-06-28 NOTE — Telephone Encounter (Signed)
Patient stated she would like too.

## 2016-06-28 NOTE — Telephone Encounter (Signed)
Patient stated that she wants to know if there is any other way to lower Triglycerides? Patient is already dieting. Is there a medication she can take to lower? Please advise.

## 2016-06-28 NOTE — Telephone Encounter (Signed)
We can start medication if she desires.

## 2016-06-28 NOTE — Telephone Encounter (Signed)
Rx sent 

## 2016-07-03 ENCOUNTER — Other Ambulatory Visit: Payer: Self-pay | Admitting: Family Medicine

## 2016-07-03 DIAGNOSIS — Z1231 Encounter for screening mammogram for malignant neoplasm of breast: Secondary | ICD-10-CM

## 2016-08-07 ENCOUNTER — Other Ambulatory Visit: Payer: Self-pay | Admitting: Family Medicine

## 2016-08-09 ENCOUNTER — Other Ambulatory Visit: Payer: Self-pay | Admitting: Family Medicine

## 2016-08-09 ENCOUNTER — Ambulatory Visit
Admission: RE | Admit: 2016-08-09 | Discharge: 2016-08-09 | Disposition: A | Discharge status: Home / Self Care | Payer: 59 | Source: Ambulatory Visit | Attending: Family Medicine | Admitting: Family Medicine

## 2016-08-09 DIAGNOSIS — Z1231 Encounter for screening mammogram for malignant neoplasm of breast: Secondary | ICD-10-CM | POA: Diagnosis not present

## 2016-08-15 ENCOUNTER — Other Ambulatory Visit: Payer: Self-pay | Admitting: Family Medicine

## 2016-08-15 NOTE — Telephone Encounter (Signed)
faxed

## 2016-08-15 NOTE — Telephone Encounter (Signed)
Refilled 04/05/16. Pt last seen 04/05/16. Please advise?

## 2016-09-05 ENCOUNTER — Other Ambulatory Visit: Payer: Self-pay | Admitting: Internal Medicine

## 2016-09-22 ENCOUNTER — Other Ambulatory Visit: Payer: Self-pay | Admitting: Nurse Practitioner

## 2016-09-22 ENCOUNTER — Other Ambulatory Visit: Payer: Self-pay | Admitting: Family Medicine

## 2016-09-25 ENCOUNTER — Telehealth: Payer: Self-pay | Admitting: Family Medicine

## 2016-09-25 DIAGNOSIS — E782 Mixed hyperlipidemia: Secondary | ICD-10-CM

## 2016-09-25 NOTE — Telephone Encounter (Signed)
Pt called and would like to have lab work done before her appt on 12/14. She would like to have them done at [facility]. Please advise, thank you!  Call pt @ [phone]

## 2016-09-25 NOTE — Telephone Encounter (Signed)
Recommendations? If okay, please place orders.

## 2016-09-26 ENCOUNTER — Other Ambulatory Visit: Payer: Self-pay | Admitting: Family Medicine

## 2016-09-26 DIAGNOSIS — E782 Mixed hyperlipidemia: Secondary | ICD-10-CM

## 2016-09-26 NOTE — Telephone Encounter (Signed)
Orders in for lipid panel.

## 2016-10-03 LAB — LIPID PANEL
Chol/HDL Ratio: 2.5 ratio units (ref 0.0–4.4)
Cholesterol, Total: 217 mg/dL — ABNORMAL HIGH (ref 100–199)
HDL: 87 mg/dL (ref >39)
LDL Calculated: 94 mg/dL (ref 0–99)
Triglycerides: 179 mg/dL — ABNORMAL HIGH (ref 0–149)
VLDL Cholesterol Cal: 36 mg/dL (ref 5–40)

## 2016-10-05 ENCOUNTER — Ambulatory Visit (INDEPENDENT_AMBULATORY_CARE_PROVIDER_SITE_OTHER): Payer: 59 | Admitting: Family Medicine

## 2016-10-05 ENCOUNTER — Encounter: Payer: Self-pay | Admitting: Family Medicine

## 2016-10-05 VITALS — BP 98/64 | HR 88 | Temp 98.1°F | Resp 12 | Wt 113.0 lb

## 2016-10-05 DIAGNOSIS — E782 Mixed hyperlipidemia: Secondary | ICD-10-CM

## 2016-10-05 DIAGNOSIS — G8929 Other chronic pain: Secondary | ICD-10-CM | POA: Diagnosis not present

## 2016-10-05 DIAGNOSIS — M545 Low back pain: Secondary | ICD-10-CM | POA: Diagnosis not present

## 2016-10-05 DIAGNOSIS — I10 Essential (primary) hypertension: Secondary | ICD-10-CM

## 2016-10-05 MED ORDER — ATORVASTATIN CALCIUM 40 MG PO TABS: 40.0000 mg | ORAL_TABLET | Freq: Every day | ORAL | 0 refills | Status: AC

## 2016-10-05 MED ORDER — ATORVASTATIN CALCIUM 40 MG PO TABS: 40.0000 mg | ORAL_TABLET | Freq: Every day | ORAL | 3 refills | Status: DC

## 2016-10-05 NOTE — Assessment & Plan Note (Signed)
Stable. Tapering off Amitriptyline. Continue PRN Ultracet.

## 2016-10-05 NOTE — Progress Notes (Signed)
Subjective:  Patient ID: Margaret Fernandez, female    DOB: 01/12/1951  Age: 65 y.o. MRN: [id number]  CC: Follow up  HPI:  65 year old female with prediabetes, hypertension, hyperlipidemia, chronic low back pain presents for follow-up.  HTN  Well controlled.  Currently on HCTZ and amlodipine.  Her blood pressures have been so well controlled, we will discuss discontinuation of medication today.  HLD  Improving after addition of Tricor.  Most recent LDL was 94.  We'll discuss changing statin therapy today.  Chronic low back pain  Stable at this time on Ultracet.  She is also on amitriptyline.  She would like to discuss potential discontinuation of amitriptyline.  Social Hx   Social History   Social History  . Marital status: Divorced    Spouse name: N/A  . Number of children: N/A  . Years of education: N/A   Social History Main Topics  . Smoking status: Former Smoker    Quit date: 10/23/2013  . Smokeless tobacco: Never Used  . Alcohol use 0.6 oz/week    1 Glasses of wine per week  . Drug use: No  . Sexual activity: Not Currently   Other Topics Concern  . None   Social History Narrative   Works at [employer]- Works on [employer] by herself   2 Cats live inside   [place] level of education- GED   From [place], moved here in [place]  Constitutional: Negative.   Musculoskeletal: Positive for back pain.   Objective:  BP 98/64 (BP Location: Left Arm, Patient Position: Sitting, Cuff Size: Normal)   Pulse 88   Temp 98.1 F (36.7 C) (Oral)   Resp 12   Wt 113 lb (51.3 kg)   SpO2 98%   BMI 22.82 kg/m   BP/Weight 10/05/2016 04/05/2016 [id number]  Systolic BP 98 A999333 A999333  Diastolic BP 64 80 70  Wt. (Lbs) 113 114.5 125  BMI 22.82 23.11 25.23   Physical Exam  Constitutional: She is oriented to person, place, and time. She appears well-developed. No distress.  Cardiovascular: Normal rate and regular rhythm.     Pulmonary/Chest: Effort normal and breath sounds normal.  Abdominal: Soft. She exhibits no distension. There is no tenderness.  Neurological: She is alert and oriented to person, place, and time.  Psychiatric: She has a normal mood and affect.  Vitals reviewed.  Lab Results  Component Value Date   WBC 4.9 05/02/2016   HCT 37.9 05/02/2016   PLT 231 05/02/2016   GLUCOSE 83 05/02/2016   CHOL 217 (H) 10/02/2016   TRIG 179 (H) 10/02/2016   HDL 87 10/02/2016   LDLCALC 94 10/02/2016   ALT 27 05/02/2016   AST 32 05/02/2016   NA 141 05/02/2016   K 3.8 05/02/2016   CL 98 05/02/2016   CREATININE 0.47 (L) 05/02/2016   BUN 10 05/02/2016   CO2 25 05/02/2016   HGBA1C 5.3 05/02/2016    Assessment & Plan:   Problem List Items Addressed This Visit    Hyperlipidemia    Improving. Continue Tricor. Switching Lovastatin to Lipitor.      Relevant Medications   atorvastatin (LIPITOR) 40 MG tablet   HTN (hypertension) - Primary    Well controlled. Stopping HCTZ. Continue Norvasc.      Relevant Medications   atorvastatin (LIPITOR) 40 MG tablet   Chronic low back pain    Stable. Tapering off Amitriptyline. Continue PRN Ultracet.  Meds ordered this encounter  Medications  . atorvastatin (LIPITOR) 40 MG tablet    Sig: Take 1 tablet (40 mg total) by mouth daily.    Dispense:  90 tablet    Refill:  0   Follow-up: 3 months  [place] DO [facility]

## 2016-10-05 NOTE — Assessment & Plan Note (Signed)
Improving. Continue Tricor. Switching Lovastatin to Lipitor.

## 2016-10-05 NOTE — Progress Notes (Signed)
Pre visit review using our clinic review tool, if applicable. No additional management support is needed unless otherwise documented below in the visit note. 

## 2016-10-05 NOTE — Patient Instructions (Signed)
Stop the lovastatin. Start the lipitor.  Taper the Amitriptyline.  Follow in in March.  Take care  Dr. Lacinda Axon

## 2016-10-05 NOTE — Assessment & Plan Note (Signed)
Well controlled. Stopping HCTZ. Continue Norvasc.

## 2016-10-24 ENCOUNTER — Other Ambulatory Visit: Payer: Self-pay | Admitting: Nurse Practitioner

## 2016-11-06 ENCOUNTER — Other Ambulatory Visit: Payer: Self-pay | Admitting: Family Medicine

## 2016-11-06 MED ORDER — AMITRIPTYLINE HCL 50 MG PO TABS: 50.0000 mg | ORAL_TABLET | Freq: Every day | ORAL | 3 refills | Status: AC

## 2016-11-06 NOTE — Telephone Encounter (Signed)
Refilled 12/06/15. Pt last seen 10/05/16. Please advise?

## 2016-11-16 ENCOUNTER — Other Ambulatory Visit: Payer: Self-pay | Admitting: Family Medicine

## 2016-11-17 NOTE — Telephone Encounter (Signed)
Refilled  1024/17. Pt last seen 10/05/16. Please advise?

## 2016-11-17 NOTE — Telephone Encounter (Signed)
faxed

## 2016-12-13 ENCOUNTER — Encounter: Payer: Self-pay | Admitting: *Deleted

## 2016-12-13 ENCOUNTER — Emergency Department
Admission: EM | Admit: 2016-12-13 | Discharge: 2016-12-13 | Disposition: A | Discharge status: Home / Self Care | Payer: 59 | Attending: Student in an Organized Health Care Education/Training Program | Admitting: Student in an Organized Health Care Education/Training Program

## 2016-12-13 DIAGNOSIS — Y92512 Supermarket, store or market as the place of occurrence of the external cause: Secondary | ICD-10-CM | POA: Diagnosis not present

## 2016-12-13 DIAGNOSIS — Z85820 Personal history of malignant melanoma of skin: Secondary | ICD-10-CM | POA: Diagnosis not present

## 2016-12-13 DIAGNOSIS — W0110XA Fall on same level from slipping, tripping and stumbling with subsequent striking against unspecified object, initial encounter: Secondary | ICD-10-CM | POA: Insufficient documentation

## 2016-12-13 DIAGNOSIS — Y999 Unspecified external cause status: Secondary | ICD-10-CM | POA: Insufficient documentation

## 2016-12-13 DIAGNOSIS — S01112A Laceration without foreign body of left eyelid and periocular area, initial encounter: Secondary | ICD-10-CM | POA: Diagnosis not present

## 2016-12-13 DIAGNOSIS — I10 Essential (primary) hypertension: Secondary | ICD-10-CM | POA: Insufficient documentation

## 2016-12-13 DIAGNOSIS — S0083XA Contusion of other part of head, initial encounter: Secondary | ICD-10-CM

## 2016-12-13 DIAGNOSIS — Y939 Activity, unspecified: Secondary | ICD-10-CM | POA: Diagnosis not present

## 2016-12-13 DIAGNOSIS — Z79899 Other long term (current) drug therapy: Secondary | ICD-10-CM | POA: Insufficient documentation

## 2016-12-13 DIAGNOSIS — Z87891 Personal history of nicotine dependence: Secondary | ICD-10-CM | POA: Insufficient documentation

## 2016-12-13 DIAGNOSIS — Z7982 Long term (current) use of aspirin: Secondary | ICD-10-CM | POA: Diagnosis not present

## 2016-12-13 DIAGNOSIS — S0993XA Unspecified injury of face, initial encounter: Secondary | ICD-10-CM | POA: Diagnosis present

## 2016-12-13 DIAGNOSIS — S0181XA Laceration without foreign body of other part of head, initial encounter: Secondary | ICD-10-CM

## 2016-12-13 NOTE — ED Provider Notes (Signed)
[facility] Emergency Department Provider Note   ____________________________________________   First MD Initiated Contact with Patient 12/13/16 [phone]     (approximate)  I have reviewed the triage vital signs and the nursing notes.   HISTORY  Chief Complaint Fall and Laceration    HPI Margaret Fernandez is a 66 y.o. female patient complaining of head laceration secondary to a trip and fall at a store. Patient denies LOC.Incident occurred yesterday approximately 5 PM. Patient states still managed cleaning the wound up last [employer]ri[employer]. Patient state she talked to her sister and was following head injury protocol awaking up every 3 hours. She denies vision problems or vertigo. Patient was able to drive to the urgent care clinic today but the wait was so long she decided come to the emergency room.   Past Medical History:  Diagnosis Date  . Anxiety   . Cancer ([place])    skin  . Chronic back pain   . Colon polyp   . GERD (gastroesophageal reflux disease)   . Hyperlipidemia   . Hypertension   . Mixed incontinence   . Osteoporosis     Patient Active Problem List   Diagnosis Date Noted  . Chronic low back pain 04/05/2016  . Anxiety 04/05/2016  . Encounter for preventative adult health care exam with abnormal findings 04/05/2016  . Pre-diabetes 07/16/2015  . Hyperlipidemia 12/14/2014  . HTN (hypertension) 11/24/2014    Past Surgical History:  Procedure Laterality Date  . bone graph     left wrist  . BREAST BIOPSY Left 20 yrs ago   needle bx  . CHOLECYSTECTOMY  2002  . FOOT ARTHRODESIS, MODIFIED [facility]BRIDE     Both feet  . FOOT SURGERY    . HYSTEROSCOPY W/D&C    . NISSEN FUNDOPLICATION  [id number]  . WRIST SURGERY  2006    Prior to Admission medications   Medication Sig Start Date End Date Taking? Authorizing Provider  amitriptyline (ELAVIL) 50 MG tablet Take 1 tablet (50 mg total) by mouth at bedtime. 11/06/16   Coral Spikes, DO  amLODipine  (NORVASC) 5 MG tablet TAKE 1 TABLET BY MOUTH  DAILY 10/24/16   Coral Spikes, DO  aspirin 81 MG tablet Take 81 mg by mouth daily.    Historical Provider, MD  atorvastatin (LIPITOR) 40 MG tablet Take 1 tablet (40 mg total) by mouth daily. 10/05/16   Coral Spikes, DO  buPROPion (WELLBUTRIN XL) 300 MG 24 hr tablet TAKE 1 TABLET BY MOUTH  DAILY 09/25/16   Coral Spikes, DO  Calcium Carb-Cholecalciferol (CALCIUM 600 + D PO) Take 1 tablet by mouth 2 (two) times daily.    Historical Provider, MD  cholecalciferol (VITAMIN D) 1000 UNITS tablet Take 1,000 Units by mouth daily.    Historical Provider, MD  fenofibrate (TRICOR) 145 MG tablet Take 1 tablet (145 mg total) by mouth daily. 06/28/16   Coral Spikes, DO  Multiple Vitamin (MULTIVITAMIN) tablet Take 1 tablet by mouth daily.    Historical Provider, MD  Omega-3 Fatty Acids (FISH OIL) 1000 MG CAPS Take 1 capsule by mouth daily.    Historical Provider, MD  omeprazole (PRILOSEC) 40 MG capsule TAKE 1 CAPSULE BY MOUTH  DAILY 09/25/16   Coral Spikes, DO  traMADol-acetaminophen (ULTRACET) 37.5-325 MG tablet TAKE ONE TABLET BY MOUTH EVERY 8 HOURS AS NEEDED 11/17/16   Coral Spikes, DO    Allergies Penicillins and Augmentin [amoxicillin-pot clavulanate]  Family History  Problem  Relation Age of Onset  . Cancer Mother     ovarian cancer  . Hypertension Mother   . Heart disease Mother   . Cancer Father     colon cancer  . Diabetes Neg Hx   . Breast cancer Neg Hx     Social History Social History  Substance Use Topics  . Smoking status: Former Smoker    Quit date: 10/23/2013  . Smokeless tobacco: Never Used  . Alcohol use 0.6 oz/week    1 Glasses of wine per week    Review of Systems Constitutional: No fever/chills Eyes: No visual changes. ENT: No sore throat. Cardiovascular: Denies chest pain. Respiratory: Denies shortness of breath. Gastrointestinal: No abdominal pain.  No nausea, no vomiting.  No diarrhea.  No constipation. Genitourinary: Negative  for dysuria. Musculoskeletal: Chronict back pain Skin: Negative for rash. Laceration above left eyebrow. Neurological: Negative for headaches, focal weakness or numbness. Psychiatric:Anxiety Endocrine:Hypertension, hyperlipidemia, and prediabetes.    ____________________________________________   PHYSICAL EXAM:  VITAL SIGNS: ED Triage Vitals [12/13/16 0912]  Enc Vitals Group     BP 119/63     Pulse Rate 87     Resp 20     Temp 98.5 F (36.9 C)     Temp Source Oral     SpO2 98 %     Weight 120 lb (54.4 kg)     Height 4\' 11"  (1.499 m)     Head Circumference      Peak Flow      Pain Score      Pain Loc      Pain Edu?      Excl. in [place]?     Constitutional: Alert and oriented. Well appearing and in no acute distress. Eyes: Conjunctivae are normal. PERRL. EOMI. Head: Atraumatic. Nose: No congestion/rhinnorhea. Mouth/Throat: Mucous membranes are moist.  Oropharynx non-erythematous. Neck: No stridor.  No cervical spine tenderness to palpation. Hematological/Lymphatic/Immunilogical: No cervical lymphadenopathy. Cardiovascular: Normal rate, regular rhythm. Grossly normal heart sounds.  Good peripheral circulation. Respiratory: Normal respiratory effort.  No retractions. Lungs CTAB. Gastrointestinal: Soft and nontender. No distention. No abdominal bruits. No CVA tenderness. Musculoskeletal: No lower extremity tenderness nor edema.  No joint effusions. Neurologic:  Normal speech and language. No gross focal neurologic deficits are appreciated. No gait instability. Skin:  Skin is warm, dry and intact. No rash noted. 1.5 cm laceration superior left orbital area. Psychiatric: Mood and affect are normal. Speech and behavior are normal.  ____________________________________________   LABS (all labs ordered are listed, but only abnormal results are displayed)  Labs Reviewed - No data to  display ____________________________________________  EKG   ____________________________________________  RADIOLOGY   ____________________________________________   PROCEDURES  Procedure(s) performed: LACERATION REPAIR Performed by: Sable Feil Authorized by: Sable Feil Consent: Verbal consent obtained. Risks and benefits: risks, benefits and alternatives were discussed Consent given by: patient Patient identity confirmed: provided demographic data Prepped and Draped in normal sterile fashion Wound explored  Laceration Location: Left supraorbital area  Laceration Length: 1.5cm No Foreign Bodies seen or palpated Irrigation method: syringe Amount of cleaning: standard Skin closure: Dermabond Patient tolerance: Patient tolerated the procedure well with no immediate complications. Procedures  Critical Care performed: No  ____________________________________________   INITIAL IMPRESSION / ASSESSMENT AND PLAN / ED COURSE  Pertinent labs & imaging results that were available during my care of the patient were reviewed by me and considered in my medical decision making (see chart for details).  Facial laceration. Patient given  discharge care instructions for Dermabond care. Patient advised return if the ED for re-opens.      ____________________________________________   FINAL CLINICAL IMPRESSION(S) / ED DIAGNOSES  Final diagnoses:  Facial laceration, initial encounter  Contusion of face, initial encounter      NEW MEDICATIONS STARTED DURING THIS VISIT:  Discharge Medication List as of 12/13/2016  9:38 AM       Note:  This document was prepared using Dragon voice recognition software and may include unintentional dictation errors.    Sable Feil, PA-C 12/13/16 [place], MD 12/13/16 [phone]

## 2016-12-13 NOTE — ED Triage Notes (Signed)
Pt tripped and fell at [place], hit head, no LOC, no anticoagulants, pt has laceration to left forehead; bleeding has stopped

## 2016-12-21 ENCOUNTER — Telehealth: Payer: Self-pay | Admitting: Family Medicine

## 2016-12-21 NOTE — Telephone Encounter (Signed)
Do you want labs?

## 2016-12-21 NOTE — Telephone Encounter (Signed)
Pt called and stated that she would like orders put into the [facility]. She is coming in on 3/14 for her physical. Please advise, thank you!  Call pt @ [phone]

## 2016-12-22 NOTE — Telephone Encounter (Signed)
No labs needed

## 2016-12-22 NOTE — Telephone Encounter (Signed)
Per DPR a voicemail was left letting pt know labs were not needed at this time.

## 2017-01-03 ENCOUNTER — Ambulatory Visit: Payer: 59 | Admitting: Family Medicine

## 2017-01-15 ENCOUNTER — Ambulatory Visit (INDEPENDENT_AMBULATORY_CARE_PROVIDER_SITE_OTHER): Payer: 59 | Admitting: Family Medicine

## 2017-01-15 VITALS — BP 128/82 | HR 92 | Temp 98.2°F | Wt 114.5 lb

## 2017-01-15 DIAGNOSIS — Z0001 Encounter for general adult medical examination with abnormal findings: Secondary | ICD-10-CM

## 2017-01-15 DIAGNOSIS — F419 Anxiety disorder, unspecified: Secondary | ICD-10-CM

## 2017-01-15 DIAGNOSIS — H9190 Unspecified hearing loss, unspecified ear: Secondary | ICD-10-CM | POA: Diagnosis not present

## 2017-01-15 MED ORDER — BUPROPION HCL ER (XL) 150 MG PO TB24: 150.0000 mg | ORAL_TABLET | Freq: Every day | ORAL | 0 refills | Status: DC

## 2017-01-15 NOTE — Assessment & Plan Note (Signed)
New problem. Arranging audiology eval.

## 2017-01-15 NOTE — Progress Notes (Signed)
Pre visit review using our clinic review tool, if applicable. No additional management support is needed unless otherwise documented below in the visit note. 

## 2017-01-15 NOTE — Patient Instructions (Addendum)
Omeprazole as needed.  Taper the Wellbutrin as follows: 150 mg daily 1 week. Then every other day 1 week. Then stop.  Continue your other medications.  Please be sure to get your labs.  Follow up in 6 months.  Take care  Dr. Lacinda Axon   Health Maintenance, Female Adopting a healthy lifestyle and getting preventive care can go a long way to promote health and wellness. Talk with your health care provider about what schedule of regular examinations is right for you. This is a good chance for you to check in with your provider about disease prevention and staying healthy. In between checkups, there are plenty of things you can do on your own. Experts have done a lot of research about which lifestyle changes and preventive measures are most likely to keep you healthy. Ask your health care provider for more information. Weight and diet Eat a healthy diet  Be sure to include plenty of vegetables, fruits, low-fat dairy products, and lean protein.  Do not eat a lot of foods high in solid fats, added sugars, or salt.  Get regular exercise. This is one of the most important things you can do for your health.  Most adults should exercise for at least 150 minutes each week. The exercise should increase your heart rate and make you sweat (moderate-intensity exercise).  Most adults should also do strengthening exercises at least twice a week. This is in addition to the moderate-intensity exercise. Maintain a healthy weight  Body mass index (BMI) is a measurement that can be used to identify possible weight problems. It estimates body fat based on height and weight. Your health care provider can help determine your BMI and help you achieve or maintain a healthy weight.  For females 43 years of age and older:  A BMI below 18.5 is considered underweight.  A BMI of 18.5 to 24.9 is normal.  A BMI of 25 to 29.9 is considered overweight.  A BMI of 30 and above is considered obese. Watch levels of  cholesterol and blood lipids  You should start having your blood tested for lipids and cholesterol at 65 years of age, then have this test every 5 years.  You may need to have your cholesterol levels checked more often if:  Your lipid or cholesterol levels are high.  You are older than 65 years of age.  You are at high risk for heart disease. Cancer screening Lung Cancer  Lung cancer screening is recommended for adults 85-17 years old who are at high risk for lung cancer because of a history of smoking.  A yearly low-dose CT scan of the lungs is recommended for people who:  Currently smoke.  Have quit within the past 15 years.  Have at least a 30-pack-year history of smoking. A pack year is smoking an average of one pack of cigarettes a day for 1 year.  Yearly screening should continue until it has been 15 years since you quit.  Yearly screening should stop if you develop a health problem that would prevent you from having lung cancer treatment. Breast Cancer  Practice breast self-awareness. This means understanding how your breasts normally appear and feel.  It also means doing regular breast self-exams. Let your health care provider know about any changes, no matter how small.  If you are in your 20s or 30s, you should have a clinical breast exam (CBE) by a health care provider every 1-3 years as part of a regular health exam.  If  you are 40 or older, have a CBE every year. Also consider having a breast X-ray (mammogram) every year.  If you have a family history of breast cancer, talk to your health care provider about genetic screening.  If you are at high risk for breast cancer, talk to your health care provider about having an MRI and a mammogram every year.  Breast cancer gene (BRCA) assessment is recommended for women who have family members with BRCA-related cancers. BRCA-related cancers include:  Breast.  Ovarian.  Tubal.  Peritoneal cancers.  Results of  the assessment will determine the need for genetic counseling and BRCA1 and BRCA2 testing. Cervical Cancer  Your health care provider may recommend that you be screened regularly for cancer of the pelvic organs (ovaries, uterus, and vagina). This screening involves a pelvic examination, including checking for microscopic changes to the surface of your cervix (Pap test). You may be encouraged to have this screening done every 3 years, beginning at age 23.  For women ages 53-65, health care providers may recommend pelvic exams and Pap testing every 3 years, or they may recommend the Pap and pelvic exam, combined with testing for human papilloma virus (HPV), every 5 years. Some types of HPV increase your risk of cervical cancer. Testing for HPV may also be done on women of any age with unclear Pap test results.  Other health care providers may not recommend any screening for nonpregnant women who are considered low risk for pelvic cancer and who do not have symptoms. Ask your health care provider if a screening pelvic exam is right for you.  If you have had past treatment for cervical cancer or a condition that could lead to cancer, you need Pap tests and screening for cancer for at least 20 years after your treatment. If Pap tests have been discontinued, your risk factors (such as having a new sexual partner) need to be reassessed to determine if screening should resume. Some women have medical problems that increase the chance of getting cervical cancer. In these cases, your health care provider may recommend more frequent screening and Pap tests. Colorectal Cancer  This type of cancer can be detected and often prevented.  Routine colorectal cancer screening usually begins at 65 years of age and continues through 65 years of age.  Your health care provider may recommend screening at an earlier age if you have risk factors for colon cancer.  Your health care provider may also recommend using home test  kits to check for hidden blood in the stool.  A small camera at the end of a tube can be used to examine your colon directly (sigmoidoscopy or colonoscopy). This is done to check for the earliest forms of colorectal cancer.  Routine screening usually begins at age 12.  Direct examination of the colon should be repeated every 5-10 years through 65 years of age. However, you may need to be screened more often if early forms of precancerous polyps or small growths are found. Skin Cancer  Check your skin from head to toe regularly.  Tell your health care provider about any new moles or changes in moles, especially if there is a change in a mole's shape or color.  Also tell your health care provider if you have a mole that is larger than the size of a pencil eraser.  Always use sunscreen. Apply sunscreen liberally and repeatedly throughout the day.  Protect yourself by wearing long sleeves, pants, a wide-brimmed hat, and sunglasses whenever you  are outside. Heart disease, diabetes, and high blood pressure  High blood pressure causes heart disease and increases the risk of stroke. High blood pressure is more likely to develop in:  People who have blood pressure in the high end of the normal range (130-139/85-89 mm Hg).  People who are overweight or obese.  People who are African American.  If you are 81-29 years of age, have your blood pressure checked every 3-5 years. If you are 46 years of age or older, have your blood pressure checked every year. You should have your blood pressure measured twice-once when you are at a hospital or clinic, and once when you are not at a hospital or clinic. Record the average of the two measurements. To check your blood pressure when you are not at a hospital or clinic, you can use:  An automated blood pressure machine at a pharmacy.  A home blood pressure monitor.  If you are between 80 years and 54 years old, ask your health care provider if you should  take aspirin to prevent strokes.  Have regular diabetes screenings. This involves taking a blood sample to check your fasting blood sugar level.  If you are at a normal weight and have a low risk for diabetes, have this test once every three years after 65 years of age.  If you are overweight and have a high risk for diabetes, consider being tested at a younger age or more often. Preventing infection Hepatitis B  If you have a higher risk for hepatitis B, you should be screened for this virus. You are considered at high risk for hepatitis B if:  You were born in a country where hepatitis B is common. Ask your health care provider which countries are considered high risk.  Your parents were born in a high-risk country, and you have not been immunized against hepatitis B (hepatitis B vaccine).  You have HIV or AIDS.  You use needles to inject street drugs.  You live with someone who has hepatitis B.  You have had sex with someone who has hepatitis B.  You get hemodialysis treatment.  You take certain medicines for conditions, including cancer, organ transplantation, and autoimmune conditions. Hepatitis C  Blood testing is recommended for:  Everyone born from [birth year] through 1964.  Anyone with known risk factors for hepatitis C. Sexually transmitted infections (STIs)  You should be screened for sexually transmitted infections (STIs) including gonorrhea and chlamydia if:  You are sexually active and are younger than 65 years of age.  You are older than 65 years of age and your health care provider tells you that you are at risk for this type of infection.  Your sexual activity has changed since you were last screened and you are at an increased risk for chlamydia or gonorrhea. Ask your health care provider if you are at risk.  If you do not have HIV, but are at risk, it may be recommended that you take a prescription medicine daily to prevent HIV infection. This is called  pre-exposure prophylaxis (PrEP). You are considered at risk if:  You are sexually active and do not regularly use condoms or know the HIV status of your partner(s).  You take drugs by injection.  You are sexually active with a partner who has HIV. Talk with your health care provider about whether you are at high risk of being infected with HIV. If you choose to begin PrEP, you should first be tested for HIV. You  should then be tested every 3 months for as long as you are taking PrEP. Pregnancy  If you are premenopausal and you may become pregnant, ask your health care provider about preconception counseling.  If you may become pregnant, take 400 to 800 micrograms (mcg) of folic acid every day.  If you want to prevent pregnancy, talk to your health care provider about birth control (contraception). Osteoporosis and menopause  Osteoporosis is a disease in which the bones lose minerals and strength with aging. This can result in serious bone fractures. Your risk for osteoporosis can be identified using a bone density scan.  If you are 22 years of age or older, or if you are at risk for osteoporosis and fractures, ask your health care provider if you should be screened.  Ask your health care provider whether you should take a calcium or vitamin D supplement to lower your risk for osteoporosis.  Menopause may have certain physical symptoms and risks.  Hormone replacement therapy may reduce some of these symptoms and risks. Talk to your health care provider about whether hormone replacement therapy is right for you. Follow these instructions at home:  Schedule regular health, dental, and eye exams.  Stay current with your immunizations.  Do not use any tobacco products including cigarettes, chewing tobacco, or electronic cigarettes.  If you are pregnant, do not drink alcohol.  If you are breastfeeding, limit how much and how often you drink alcohol.  Limit alcohol intake to no more  than 1 drink per day for nonpregnant women. One drink equals 12 ounces of beer, 5 ounces of wine, or 1 ounces of hard liquor.  Do not use street drugs.  Do not share needles.  Ask your health care provider for help if you need support or information about quitting drugs.  Tell your health care provider if you often feel depressed.  Tell your health care provider if you have ever been abused or do not feel safe at home. This information is not intended to replace advice given to you by your health care provider. Make sure you discuss any questions you have with your health care provider. Document Released: 04/24/2011 Document Revised: 03/16/2016 Document Reviewed: 07/13/2015 Elsevier Interactive Patient Education  2017 [employer].

## 2017-01-15 NOTE — Progress Notes (Signed)
Subjective:  Patient ID: Margaret Fernandez, female    DOB: 09-Aug-1951  Age: 65 y.o. MRN: [id number]  CC: Annual physical  HPI Margaret Fernandez is a 66 y.o. female presents to the clinic today for a physical exam.  Preventative Healthcare  Pap smear: Up to date.  Mammogram: Up to date.  Colonoscopy: Up to date.  Immunizations  Tetanus - Up to date.  Pneumococcal - N/A.  Flu - Up to date.  Zoster - Up to date.  Hepatitis C screening - Desires.  Labs: In need of labs.  Alcohol use: See below.  Smoking/tobacco use: Former.  STD/HIV testing: Declines.  Anxiety  Stable and doing well.  Wants to taper off Wellbutrin.  Hearing loss  Reports ongoing issues with hearing difficulty.  Desires audiology eval.   PMH, Surgical Hx, Family Hx, Social History reviewed and updated as below.  Past Medical History:  Diagnosis Date  . Anxiety   . Cancer ([place])    skin  . Chronic back pain   . Colon polyp   . GERD (gastroesophageal reflux disease)   . Hyperlipidemia   . Hypertension   . Mixed incontinence   . Osteoporosis    Past Surgical History:  Procedure Laterality Date  . bone graph     left wrist  . BREAST BIOPSY Left 20 yrs ago   needle bx  . CHOLECYSTECTOMY  2002  . FOOT ARTHRODESIS, MODIFIED MCBRIDE     Both feet  . FOOT SURGERY    . HYSTEROSCOPY W/D&C    . NISSEN FUNDOPLICATION  8341  . WRIST SURGERY  2006   Family History  Problem Relation Age of Onset  . Cancer Mother     ovarian cancer  . Hypertension Mother   . Heart disease Mother   . Cancer Father     colon cancer  . Diabetes Neg Hx   . Breast cancer Neg Hx    Social History  Substance Use Topics  . Smoking status: Former Smoker    Quit date: 10/23/2013  . Smokeless tobacco: Never Used  . Alcohol use 0.6 oz/week    1 Glasses of wine per week   Review of Systems  HENT: Positive for hearing loss.   Psychiatric/Behavioral: The patient is nervous/anxious.   All other systems  reviewed and are negative.   Objective:   Today's Vitals: BP 128/82   Pulse 92   Temp 98.2 F (36.8 C) (Oral)   Wt 114 lb 8 oz (51.9 kg)   SpO2 96%   BMI 23.13 kg/m   Physical Exam  Constitutional: She is oriented to person, place, and time. She appears well-developed and well-nourished. No distress.  HENT:  Head: Normocephalic and atraumatic.  Nose: Nose normal.  Mouth/Throat: Oropharynx is clear and moist. No oropharyngeal exudate.  Normal TM's bilaterally.   Eyes: Conjunctivae are normal. No scleral icterus.  Neck: Neck supple.  Cardiovascular: Normal rate and regular rhythm.   No murmur heard. Pulmonary/Chest: Effort normal and breath sounds normal. She has no wheezes. She has no rales.  Abdominal: Soft. She exhibits no distension. There is no tenderness. There is no rebound and no guarding.  Musculoskeletal: Normal range of motion. She exhibits no edema.  Lymphadenopathy:    She has no cervical adenopathy.  Neurological: She is alert and oriented to person, place, and time.  Skin: Skin is warm and dry. No rash noted.  Psychiatric: She has a normal mood and affect.  Vitals reviewed.  Assessment & Plan:   Problem List Items Addressed This Visit    Anxiety    Improved. Tapering Wellbutrin - 150 mg daily 1 week, then every other day 1 week.      Relevant Medications   buPROPion (WELLBUTRIN XL) 150 MG 24 hr tablet   Encounter for general adult medical examination with abnormal findings - Primary    Declines HIV screening. Proceeding with hepatitis C screening Remainder of preventative healthcare up-to-date.       Relevant Orders   CBC   Comprehensive metabolic panel   Lipid panel   Hepatitis C Antibody   Hearing loss    New problem. Arranging audiology eval.      Relevant Orders   Ambulatory referral to Audiology      Outpatient Encounter Prescriptions as of 01/15/2017  Medication Sig  . amitriptyline (ELAVIL) 50 MG tablet Take 1 tablet (50 mg  total) by mouth at bedtime.  Marland Kitchen amLODipine (NORVASC) 5 MG tablet TAKE 1 TABLET BY MOUTH  DAILY  . aspirin 81 MG tablet Take 81 mg by mouth daily.  Marland Kitchen atorvastatin (LIPITOR) 40 MG tablet Take 1 tablet (40 mg total) by mouth daily.  . Calcium Carb-Cholecalciferol (CALCIUM 600 + D PO) Take 1 tablet by mouth 2 (two) times daily.  . cholecalciferol (VITAMIN D) 1000 UNITS tablet Take 1,000 Units by mouth daily.  . fenofibrate (TRICOR) 145 MG tablet Take 1 tablet (145 mg total) by mouth daily.  . Multiple Vitamin (MULTIVITAMIN) tablet Take 1 tablet by mouth daily.  Marland Kitchen omeprazole (PRILOSEC) 40 MG capsule TAKE 1 CAPSULE BY MOUTH  DAILY  . traMADol-acetaminophen (ULTRACET) 37.5-325 MG tablet TAKE ONE TABLET BY MOUTH EVERY 8 HOURS AS NEEDED  . [DISCONTINUED] buPROPion (WELLBUTRIN XL) 300 MG 24 hr tablet TAKE 1 TABLET BY MOUTH  DAILY  . [DISCONTINUED] Omega-3 Fatty Acids (FISH OIL) 1000 MG CAPS Take 1 capsule by mouth daily.  Marland Kitchen buPROPion (WELLBUTRIN XL) 150 MG 24 hr tablet Take 1 tablet (150 mg total) by mouth daily.  . [DISCONTINUED] buPROPion (WELLBUTRIN XL) 150 MG 24 hr tablet Take 1 tablet (150 mg total) by mouth daily.   No facility-administered encounter medications on file as of 01/15/2017.     Follow-up: 6 months  [place] DO [facility]

## 2017-01-15 NOTE — Assessment & Plan Note (Signed)
Improved. Tapering Wellbutrin - 150 mg daily 1 week, then every other day 1 week.

## 2017-01-15 NOTE — Assessment & Plan Note (Signed)
Declines HIV screening. Proceeding with hepatitis C screening Remainder of preventative healthcare up-to-date.

## 2017-01-18 ENCOUNTER — Telehealth: Payer: Self-pay | Admitting: Family Medicine

## 2017-01-18 LAB — LIPID PANEL
Chol/HDL Ratio: 2.5 ratio units (ref 0.0–4.4)
Cholesterol, Total: 194 mg/dL (ref 100–199)
HDL: 78 mg/dL (ref >39)
LDL Calculated: 78 mg/dL (ref 0–99)
Triglycerides: 191 mg/dL — ABNORMAL HIGH (ref 0–149)
VLDL Cholesterol Cal: 38 mg/dL (ref 5–40)

## 2017-01-18 LAB — COMPREHENSIVE METABOLIC PANEL
ALT: 18 IU/L (ref 0–32)
AST: 27 IU/L (ref 0–40)
Albumin/Globulin Ratio: 2 (ref 1.2–2.2)
Albumin: 4.8 g/dL (ref 3.6–4.8)
Alkaline Phosphatase: 70 IU/L (ref 39–117)
BUN/Creatinine Ratio: 25 (ref 12–28)
BUN: 15 mg/dL (ref 8–27)
Bilirubin Total: 0.4 mg/dL (ref 0.0–1.2)
CO2: 22 mmol/L (ref 18–29)
Calcium: 9.8 mg/dL (ref 8.7–10.3)
Chloride: 99 mmol/L (ref 96–106)
Creatinine, Ser: 0.6 mg/dL (ref 0.57–1.00)
GFR calc Af Amer: 111 mL/min/{1.73_m2} (ref >59)
GFR calc non Af Amer: 97 mL/min/{1.73_m2} (ref >59)
Globulin, Total: 2.4 g/dL (ref 1.5–4.5)
Glucose: 94 mg/dL (ref 65–99)
Potassium: 4.4 mmol/L (ref 3.5–5.2)
Sodium: 139 mmol/L (ref 134–144)
Total Protein: 7.2 g/dL (ref 6.0–8.5)

## 2017-01-18 LAB — CBC
Hematocrit: 39 % (ref 34.0–46.6)
Hemoglobin: 13.2 g/dL (ref 11.1–15.9)
MCH: 30.1 pg (ref 26.6–33.0)
MCHC: 33.8 g/dL (ref 31.5–35.7)
MCV: 89 fL (ref 79–97)
Platelets: 317 10*3/uL (ref 150–379)
RBC: 4.38 x10E6/uL (ref 3.77–5.28)
RDW: 12.8 % (ref 12.3–15.4)
WBC: 6.9 10*3/uL (ref 3.4–10.8)

## 2017-01-18 LAB — HEPATITIS C ANTIBODY: Hep C Virus Ab: <0.1 s/co ratio (ref 0.0–0.9)

## 2017-01-18 NOTE — Telephone Encounter (Signed)
Pt called back returning your call . Thank you!  Call pt @ [phone]

## 2017-01-18 NOTE — Telephone Encounter (Signed)
Pt given results  

## 2017-03-09 ENCOUNTER — Other Ambulatory Visit: Payer: Self-pay | Admitting: Family Medicine

## 2017-03-09 NOTE — Telephone Encounter (Signed)
Please advise, thanks.

## 2017-03-13 NOTE — Telephone Encounter (Signed)
Script faxed to [facility].

## 2017-04-13 ENCOUNTER — Other Ambulatory Visit: Payer: Self-pay | Admitting: Family Medicine

## 2017-04-13 MED ORDER — BUPROPION HCL ER (XL) 150 MG PO TB24: 150.0000 mg | ORAL_TABLET | Freq: Every day | ORAL | 0 refills | Status: AC

## 2017-04-13 NOTE — Telephone Encounter (Signed)
Patient wants to change back to 150 a day, please advise, thanks

## 2017-04-16 ENCOUNTER — Encounter: Payer: Self-pay | Admitting: Family Medicine

## 2017-05-25 ENCOUNTER — Other Ambulatory Visit: Payer: Self-pay | Admitting: Family Medicine

## 2017-07-05 ENCOUNTER — Other Ambulatory Visit: Payer: Self-pay | Admitting: Family Medicine

## 2017-07-05 DIAGNOSIS — M810 Age-related osteoporosis without current pathological fracture: Secondary | ICD-10-CM | POA: Insufficient documentation

## 2017-07-05 DIAGNOSIS — I10 Essential (primary) hypertension: Secondary | ICD-10-CM | POA: Insufficient documentation

## 2017-07-05 DIAGNOSIS — Z1231 Encounter for screening mammogram for malignant neoplasm of breast: Secondary | ICD-10-CM

## 2017-07-05 DIAGNOSIS — E7800 Pure hypercholesterolemia, unspecified: Secondary | ICD-10-CM | POA: Insufficient documentation

## 2017-07-18 ENCOUNTER — Ambulatory Visit: Payer: 59 | Admitting: Family Medicine

## 2017-07-23 ENCOUNTER — Ambulatory Visit: Payer: 59 | Admitting: Family Medicine

## 2017-08-13 ENCOUNTER — Ambulatory Visit
Admission: RE | Admit: 2017-08-13 | Discharge: 2017-08-13 | Disposition: A | Discharge status: Home / Self Care | Payer: 59 | Source: Ambulatory Visit | Attending: Family Medicine | Admitting: Family Medicine

## 2017-08-13 DIAGNOSIS — Z1231 Encounter for screening mammogram for malignant neoplasm of breast: Secondary | ICD-10-CM

## 2017-08-15 ENCOUNTER — Ambulatory Visit: Payer: 59 | Admitting: Family Medicine

## 2017-08-15 ENCOUNTER — Encounter: Payer: Self-pay | Admitting: Family Medicine

## 2017-08-18 ENCOUNTER — Other Ambulatory Visit: Payer: Self-pay | Admitting: Family Medicine

## 2017-12-11 ENCOUNTER — Ambulatory Visit: Payer: Self-pay | Admitting: Podiatry

## 2018-01-15 ENCOUNTER — Ambulatory Visit (INDEPENDENT_AMBULATORY_CARE_PROVIDER_SITE_OTHER): Payer: Medicare HMO

## 2018-01-15 ENCOUNTER — Ambulatory Visit: Payer: Medicare HMO | Admitting: Podiatry

## 2018-01-15 ENCOUNTER — Encounter: Payer: Self-pay | Admitting: Podiatry

## 2018-01-15 DIAGNOSIS — M7751 Other enthesopathy of right foot: Secondary | ICD-10-CM | POA: Diagnosis not present

## 2018-01-15 DIAGNOSIS — Z23 Encounter for immunization: Secondary | ICD-10-CM | POA: Diagnosis not present

## 2018-01-15 DIAGNOSIS — M779 Enthesopathy, unspecified: Principal | ICD-10-CM

## 2018-01-15 DIAGNOSIS — R739 Hyperglycemia, unspecified: Secondary | ICD-10-CM | POA: Diagnosis not present

## 2018-01-15 DIAGNOSIS — M778 Other enthesopathies, not elsewhere classified: Secondary | ICD-10-CM

## 2018-01-15 DIAGNOSIS — Z0000 Encounter for general adult medical examination without abnormal findings: Secondary | ICD-10-CM | POA: Diagnosis not present

## 2018-01-15 MED ORDER — MELOXICAM 15 MG PO TABS: 15.0000 mg | ORAL_TABLET | Freq: Every day | ORAL | 1 refills | Status: AC

## 2018-01-15 NOTE — Progress Notes (Signed)
   HPI: 67 year old female presenting as a new patient with a chief complaint of intermittent pain to the plantar aspect of the right forefoot. She reports associated pain to the third and fourth toes as well. Wearing certain shoes increases the pain. She denies alleviating factors. She has not done anything to treat the symptoms. Patient is here for further evaluation and treatment.   Past Medical History:  Diagnosis Date  . Anxiety   . Chronic back pain   . Colon polyp   . GERD (gastroesophageal reflux disease)   . Hyperlipidemia   . Hypertension   . Mixed incontinence   . Osteoporosis       Physical Exam: General: The patient is alert and oriented x3 in no acute distress.  Dermatology: Skin is warm, dry and supple bilateral lower extremities. Negative for open lesions or macerations.  Vascular: Palpable pedal pulses bilaterally. No edema or erythema noted. Capillary refill within normal limits.  Neurological: Epicritic and protective threshold grossly intact bilaterally.   Musculoskeletal Exam: Sharp pain with palpation of the 3rd interspace and lateral compression of the metatarsal heads consistent with neuroma.  Positive [place] sign with loadbearing of the forefoot.  Radiographic Exam:  Normal osseous mineralization. Joint spaces preserved. No fracture/dislocation/boney destruction.    Assessment: 1.  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 Patient was evaluated. X-Rays reviewed.  2. Patient declined injection today.  3. Prescription for Meloxicam provided to patient.  4. Met pad dispensed today.  5. Recommended good shoe gear.  6. Return to clinic as needed.    Edrick Kins, DPM [facility]  Dr. Edrick Kins, [address]                                        [place], [place] [address]                Office [phone]  Fax [phone]

## 2018-01-18 ENCOUNTER — Ambulatory Visit: Payer: Self-pay | Admitting: Podiatry

## 2018-04-08 DIAGNOSIS — N390 Urinary tract infection, site not specified: Secondary | ICD-10-CM | POA: Diagnosis not present

## 2018-04-08 DIAGNOSIS — R300 Dysuria: Secondary | ICD-10-CM | POA: Diagnosis not present

## 2018-04-14 DIAGNOSIS — R300 Dysuria: Secondary | ICD-10-CM | POA: Diagnosis not present

## 2018-04-14 DIAGNOSIS — N390 Urinary tract infection, site not specified: Secondary | ICD-10-CM | POA: Diagnosis not present

## 2018-04-23 DIAGNOSIS — K2990 Gastroduodenitis, unspecified, without bleeding: Secondary | ICD-10-CM | POA: Diagnosis not present

## 2018-04-23 DIAGNOSIS — K2970 Gastritis, unspecified, without bleeding: Secondary | ICD-10-CM | POA: Diagnosis not present

## 2018-04-23 DIAGNOSIS — K3189 Other diseases of stomach and duodenum: Secondary | ICD-10-CM | POA: Diagnosis not present

## 2018-04-23 DIAGNOSIS — K2950 Unspecified chronic gastritis without bleeding: Secondary | ICD-10-CM | POA: Diagnosis not present

## 2018-04-23 DIAGNOSIS — K2270 Barrett's esophagus without dysplasia: Secondary | ICD-10-CM | POA: Diagnosis not present

## 2018-05-27 DIAGNOSIS — H25813 Combined forms of age-related cataract, bilateral: Secondary | ICD-10-CM | POA: Diagnosis not present

## 2018-07-06 DIAGNOSIS — K0889 Other specified disorders of teeth and supporting structures: Secondary | ICD-10-CM | POA: Diagnosis not present

## 2018-07-08 DIAGNOSIS — E7800 Pure hypercholesterolemia, unspecified: Secondary | ICD-10-CM | POA: Diagnosis not present

## 2018-07-08 DIAGNOSIS — Z79899 Other long term (current) drug therapy: Secondary | ICD-10-CM | POA: Diagnosis not present

## 2018-07-08 DIAGNOSIS — R739 Hyperglycemia, unspecified: Secondary | ICD-10-CM | POA: Diagnosis not present

## 2018-07-15 ENCOUNTER — Other Ambulatory Visit: Payer: Self-pay | Admitting: Family Medicine

## 2018-07-15 DIAGNOSIS — Z1231 Encounter for screening mammogram for malignant neoplasm of breast: Secondary | ICD-10-CM

## 2018-07-16 DIAGNOSIS — R739 Hyperglycemia, unspecified: Secondary | ICD-10-CM | POA: Diagnosis not present

## 2018-07-16 DIAGNOSIS — Z79899 Other long term (current) drug therapy: Secondary | ICD-10-CM | POA: Diagnosis not present

## 2018-07-16 DIAGNOSIS — I10 Essential (primary) hypertension: Secondary | ICD-10-CM | POA: Diagnosis not present

## 2018-07-16 DIAGNOSIS — E782 Mixed hyperlipidemia: Secondary | ICD-10-CM | POA: Diagnosis not present

## 2018-07-16 DIAGNOSIS — K219 Gastro-esophageal reflux disease without esophagitis: Secondary | ICD-10-CM | POA: Diagnosis not present

## 2018-08-14 ENCOUNTER — Ambulatory Visit
Admission: RE | Admit: 2018-08-14 | Discharge: 2018-08-14 | Disposition: A | Discharge status: Home / Self Care | Payer: Medicare HMO | Source: Ambulatory Visit | Attending: Family Medicine | Admitting: Family Medicine

## 2018-08-14 DIAGNOSIS — Z1231 Encounter for screening mammogram for malignant neoplasm of breast: Secondary | ICD-10-CM | POA: Diagnosis not present

## 2018-09-02 DIAGNOSIS — D2272 Melanocytic nevi of left lower limb, including hip: Secondary | ICD-10-CM | POA: Diagnosis not present

## 2018-09-02 DIAGNOSIS — D485 Neoplasm of uncertain behavior of skin: Secondary | ICD-10-CM | POA: Diagnosis not present

## 2018-09-02 DIAGNOSIS — D2262 Melanocytic nevi of left upper limb, including shoulder: Secondary | ICD-10-CM | POA: Diagnosis not present

## 2018-09-02 DIAGNOSIS — D2261 Melanocytic nevi of right upper limb, including shoulder: Secondary | ICD-10-CM | POA: Diagnosis not present

## 2018-09-02 DIAGNOSIS — D225 Melanocytic nevi of trunk: Secondary | ICD-10-CM | POA: Diagnosis not present

## 2018-09-02 DIAGNOSIS — D2271 Melanocytic nevi of right lower limb, including hip: Secondary | ICD-10-CM | POA: Diagnosis not present

## 2018-09-02 DIAGNOSIS — L821 Other seborrheic keratosis: Secondary | ICD-10-CM | POA: Diagnosis not present

## 2019-06-02 DIAGNOSIS — H25813 Combined forms of age-related cataract, bilateral: Secondary | ICD-10-CM | POA: Diagnosis not present

## 2019-06-02 DIAGNOSIS — H524 Presbyopia: Secondary | ICD-10-CM | POA: Diagnosis not present

## 2019-07-11 ENCOUNTER — Other Ambulatory Visit: Payer: Self-pay | Admitting: Family Medicine

## 2019-07-11 DIAGNOSIS — Z1231 Encounter for screening mammogram for malignant neoplasm of breast: Secondary | ICD-10-CM

## 2019-07-14 DIAGNOSIS — Z79899 Other long term (current) drug therapy: Secondary | ICD-10-CM | POA: Diagnosis not present

## 2019-07-14 DIAGNOSIS — E7800 Pure hypercholesterolemia, unspecified: Secondary | ICD-10-CM | POA: Diagnosis not present

## 2019-07-14 DIAGNOSIS — R739 Hyperglycemia, unspecified: Secondary | ICD-10-CM | POA: Diagnosis not present

## 2019-07-18 DIAGNOSIS — E785 Hyperlipidemia, unspecified: Secondary | ICD-10-CM | POA: Diagnosis not present

## 2019-07-18 DIAGNOSIS — Z87891 Personal history of nicotine dependence: Secondary | ICD-10-CM | POA: Diagnosis not present

## 2019-07-18 DIAGNOSIS — I10 Essential (primary) hypertension: Secondary | ICD-10-CM | POA: Diagnosis not present

## 2019-07-18 DIAGNOSIS — Z23 Encounter for immunization: Secondary | ICD-10-CM | POA: Diagnosis not present

## 2019-07-18 DIAGNOSIS — F419 Anxiety disorder, unspecified: Secondary | ICD-10-CM | POA: Diagnosis not present

## 2019-07-18 DIAGNOSIS — Z79899 Other long term (current) drug therapy: Secondary | ICD-10-CM | POA: Diagnosis not present

## 2019-07-18 DIAGNOSIS — F329 Major depressive disorder, single episode, unspecified: Secondary | ICD-10-CM | POA: Diagnosis not present

## 2019-08-18 ENCOUNTER — Ambulatory Visit
Admission: RE | Admit: 2019-08-18 | Discharge: 2019-08-18 | Disposition: A | Discharge status: Home / Self Care | Payer: Medicare HMO | Source: Ambulatory Visit | Attending: Family Medicine | Admitting: Family Medicine

## 2019-08-18 DIAGNOSIS — Z1231 Encounter for screening mammogram for malignant neoplasm of breast: Secondary | ICD-10-CM | POA: Diagnosis not present

## 2019-09-25 DIAGNOSIS — M13849 Other specified arthritis, unspecified hand: Secondary | ICD-10-CM | POA: Diagnosis not present

## 2019-09-25 DIAGNOSIS — M1812 Unilateral primary osteoarthritis of first carpometacarpal joint, left hand: Secondary | ICD-10-CM | POA: Diagnosis not present

## 2019-09-25 DIAGNOSIS — M79642 Pain in left hand: Secondary | ICD-10-CM | POA: Diagnosis not present

## 2019-10-14 DIAGNOSIS — N390 Urinary tract infection, site not specified: Secondary | ICD-10-CM | POA: Diagnosis not present

## 2019-10-14 DIAGNOSIS — Z87891 Personal history of nicotine dependence: Secondary | ICD-10-CM | POA: Diagnosis not present

## 2019-10-14 DIAGNOSIS — R300 Dysuria: Secondary | ICD-10-CM | POA: Diagnosis not present

## 2019-12-21 ENCOUNTER — Ambulatory Visit: Payer: Medicare HMO | Attending: Internal Medicine

## 2019-12-21 DIAGNOSIS — Z23 Encounter for immunization: Secondary | ICD-10-CM | POA: Insufficient documentation

## 2019-12-21 NOTE — Progress Notes (Signed)
   Covid-19 Vaccination Clinic  Name:  Margaret Fernandez    MRN: [id number] DOB: 10/08/1951  12/21/2019  [name] was observed post Covid-19 immunization for 15 minutes without incidence. She was provided with Vaccine Information Sheet and instruction to access the V-Safe system.   [name] was instructed to call 911 with any severe reactions post vaccine: Marland Kitchen Difficulty breathing  . Swelling of your face and throat  . A fast heartbeat  . A bad rash all over your body  . Dizziness and weakness    Immunizations Administered    Name Date Dose VIS Date Route   Pfizer COVID-19 Vaccine 12/21/2019  9:32 AM 0.3 mL 10/03/2019 Intramuscular   Manufacturer: [place]   Lot: [id number]   [place]: [id number]

## 2020-01-12 ENCOUNTER — Ambulatory Visit: Payer: Medicare HMO | Attending: Internal Medicine

## 2020-01-12 DIAGNOSIS — Z23 Encounter for immunization: Secondary | ICD-10-CM

## 2020-01-12 NOTE — Progress Notes (Signed)
   Covid-19 Vaccination Clinic  Name:  Margaret Fernandez    MRN: [id number] DOB: [birth year]  01/12/2020  Margaret Fernandez was observed post Covid-19 immunization for 30 minutes based on pre-vaccination screening without incident. She was provided with Vaccine Information Sheet and instruction to access the V-Safe system.   Margaret Fernandez was instructed to call 911 with any severe reactions post vaccine: Marland Kitchen Difficulty breathing  . Swelling of face and throat  . A fast heartbeat  . A bad rash all over body  . Dizziness and weakness   Immunizations Administered    Name Date Dose VIS Date Route   Pfizer COVID-19 Vaccine 01/12/2020  9:40 AM 0.3 mL 10/03/2019 Intramuscular   Manufacturer: [place]   Lot: [id number]   [place]: [id number]

## 2020-01-14 DIAGNOSIS — R739 Hyperglycemia, unspecified: Secondary | ICD-10-CM | POA: Diagnosis not present

## 2020-01-14 DIAGNOSIS — E7800 Pure hypercholesterolemia, unspecified: Secondary | ICD-10-CM | POA: Diagnosis not present

## 2020-01-14 DIAGNOSIS — Z79899 Other long term (current) drug therapy: Secondary | ICD-10-CM | POA: Diagnosis not present

## 2020-01-20 DIAGNOSIS — F329 Major depressive disorder, single episode, unspecified: Secondary | ICD-10-CM | POA: Diagnosis not present

## 2020-01-20 DIAGNOSIS — Z0000 Encounter for general adult medical examination without abnormal findings: Secondary | ICD-10-CM | POA: Diagnosis not present

## 2020-02-02 DIAGNOSIS — M8588 Other specified disorders of bone density and structure, other site: Secondary | ICD-10-CM | POA: Diagnosis not present

## 2020-05-31 DIAGNOSIS — H25813 Combined forms of age-related cataract, bilateral: Secondary | ICD-10-CM | POA: Diagnosis not present

## 2020-07-06 ENCOUNTER — Other Ambulatory Visit: Payer: Self-pay | Admitting: Family Medicine

## 2020-07-06 DIAGNOSIS — Z1231 Encounter for screening mammogram for malignant neoplasm of breast: Secondary | ICD-10-CM

## 2020-07-20 DIAGNOSIS — R739 Hyperglycemia, unspecified: Secondary | ICD-10-CM | POA: Diagnosis not present

## 2020-07-20 DIAGNOSIS — Z79899 Other long term (current) drug therapy: Secondary | ICD-10-CM | POA: Diagnosis not present

## 2020-07-20 DIAGNOSIS — E782 Mixed hyperlipidemia: Secondary | ICD-10-CM | POA: Diagnosis not present

## 2020-08-13 DIAGNOSIS — I10 Essential (primary) hypertension: Secondary | ICD-10-CM | POA: Diagnosis not present

## 2020-08-13 DIAGNOSIS — Z79899 Other long term (current) drug therapy: Secondary | ICD-10-CM | POA: Diagnosis not present

## 2020-08-13 DIAGNOSIS — R739 Hyperglycemia, unspecified: Secondary | ICD-10-CM | POA: Diagnosis not present

## 2020-08-13 DIAGNOSIS — F3340 Major depressive disorder, recurrent, in remission, unspecified: Secondary | ICD-10-CM | POA: Diagnosis not present

## 2020-08-13 DIAGNOSIS — Z23 Encounter for immunization: Secondary | ICD-10-CM | POA: Diagnosis not present

## 2020-08-13 DIAGNOSIS — E7800 Pure hypercholesterolemia, unspecified: Secondary | ICD-10-CM | POA: Diagnosis not present

## 2020-08-18 ENCOUNTER — Ambulatory Visit
Admission: RE | Admit: 2020-08-18 | Discharge: 2020-08-18 | Disposition: A | Discharge status: Home / Self Care | Payer: Medicare HMO | Source: Ambulatory Visit | Attending: Family Medicine | Admitting: Family Medicine

## 2020-08-18 ENCOUNTER — Other Ambulatory Visit: Payer: Self-pay

## 2020-08-18 DIAGNOSIS — Z1231 Encounter for screening mammogram for malignant neoplasm of breast: Secondary | ICD-10-CM | POA: Diagnosis not present

## 2020-08-24 ENCOUNTER — Other Ambulatory Visit: Payer: Self-pay | Admitting: Family Medicine

## 2020-08-24 DIAGNOSIS — R928 Other abnormal and inconclusive findings on diagnostic imaging of breast: Secondary | ICD-10-CM

## 2020-09-01 ENCOUNTER — Other Ambulatory Visit: Payer: Self-pay

## 2020-09-01 ENCOUNTER — Ambulatory Visit
Admission: RE | Admit: 2020-09-01 | Discharge: 2020-09-01 | Disposition: A | Discharge status: Home / Self Care | Payer: Medicare HMO | Source: Ambulatory Visit | Attending: Family Medicine | Admitting: Family Medicine

## 2020-09-01 DIAGNOSIS — R928 Other abnormal and inconclusive findings on diagnostic imaging of breast: Secondary | ICD-10-CM

## 2020-10-05 DIAGNOSIS — M1812 Unilateral primary osteoarthritis of first carpometacarpal joint, left hand: Secondary | ICD-10-CM | POA: Diagnosis not present

## 2020-12-29 DIAGNOSIS — M67813 Other specified disorders of tendon, right shoulder: Secondary | ICD-10-CM | POA: Diagnosis not present

## 2021-02-07 DIAGNOSIS — Z79899 Other long term (current) drug therapy: Secondary | ICD-10-CM | POA: Diagnosis not present

## 2021-02-07 DIAGNOSIS — E7800 Pure hypercholesterolemia, unspecified: Secondary | ICD-10-CM | POA: Diagnosis not present

## 2021-02-07 DIAGNOSIS — R739 Hyperglycemia, unspecified: Secondary | ICD-10-CM | POA: Diagnosis not present

## 2021-02-11 DIAGNOSIS — Z1331 Encounter for screening for depression: Secondary | ICD-10-CM | POA: Diagnosis not present

## 2021-02-11 DIAGNOSIS — Z0000 Encounter for general adult medical examination without abnormal findings: Secondary | ICD-10-CM | POA: Diagnosis not present

## 2021-04-21 DIAGNOSIS — K2270 Barrett's esophagus without dysplasia: Secondary | ICD-10-CM | POA: Diagnosis not present

## 2021-04-21 DIAGNOSIS — I10 Essential (primary) hypertension: Secondary | ICD-10-CM | POA: Diagnosis not present

## 2021-04-21 DIAGNOSIS — K219 Gastro-esophageal reflux disease without esophagitis: Secondary | ICD-10-CM | POA: Diagnosis not present

## 2021-04-21 DIAGNOSIS — M810 Age-related osteoporosis without current pathological fracture: Secondary | ICD-10-CM | POA: Diagnosis not present

## 2021-05-20 ENCOUNTER — Ambulatory Visit: Admit: 2021-05-20 | Payer: Medicare HMO | Admitting: Internal Medicine

## 2021-05-20 DIAGNOSIS — K2270 Barrett's esophagus without dysplasia: Secondary | ICD-10-CM | POA: Diagnosis not present

## 2021-05-20 DIAGNOSIS — Z9889 Other specified postprocedural states: Secondary | ICD-10-CM | POA: Diagnosis not present

## 2021-05-20 SURGERY — EGD (ESOPHAGOGASTRODUODENOSCOPY)
Anesthesia: General

## 2021-06-07 DIAGNOSIS — H524 Presbyopia: Secondary | ICD-10-CM | POA: Diagnosis not present

## 2021-06-07 DIAGNOSIS — H25813 Combined forms of age-related cataract, bilateral: Secondary | ICD-10-CM | POA: Diagnosis not present

## 2021-08-11 DIAGNOSIS — R739 Hyperglycemia, unspecified: Secondary | ICD-10-CM | POA: Diagnosis not present

## 2021-08-11 DIAGNOSIS — E7800 Pure hypercholesterolemia, unspecified: Secondary | ICD-10-CM | POA: Diagnosis not present

## 2021-08-11 DIAGNOSIS — R002 Palpitations: Secondary | ICD-10-CM | POA: Diagnosis not present

## 2021-08-11 DIAGNOSIS — Z79899 Other long term (current) drug therapy: Secondary | ICD-10-CM | POA: Diagnosis not present

## 2021-08-18 DIAGNOSIS — E785 Hyperlipidemia, unspecified: Secondary | ICD-10-CM | POA: Diagnosis not present

## 2021-08-18 DIAGNOSIS — Z79899 Other long term (current) drug therapy: Secondary | ICD-10-CM | POA: Diagnosis not present

## 2021-08-18 DIAGNOSIS — R002 Palpitations: Secondary | ICD-10-CM | POA: Diagnosis not present

## 2021-08-18 DIAGNOSIS — I10 Essential (primary) hypertension: Secondary | ICD-10-CM | POA: Diagnosis not present

## 2021-08-18 DIAGNOSIS — R739 Hyperglycemia, unspecified: Secondary | ICD-10-CM | POA: Diagnosis not present

## 2021-08-31 DIAGNOSIS — Z0100 Encounter for examination of eyes and vision without abnormal findings: Secondary | ICD-10-CM | POA: Diagnosis not present

## 2021-10-14 ENCOUNTER — Other Ambulatory Visit: Payer: Self-pay | Admitting: Family Medicine

## 2021-10-14 DIAGNOSIS — Z1231 Encounter for screening mammogram for malignant neoplasm of breast: Secondary | ICD-10-CM

## 2021-10-19 ENCOUNTER — Ambulatory Visit: Payer: Medicare HMO

## 2021-10-25 DIAGNOSIS — H25813 Combined forms of age-related cataract, bilateral: Secondary | ICD-10-CM | POA: Diagnosis not present

## 2021-12-01 ENCOUNTER — Other Ambulatory Visit: Payer: Self-pay

## 2021-12-01 ENCOUNTER — Ambulatory Visit
Admission: RE | Admit: 2021-12-01 | Discharge: 2021-12-01 | Disposition: A | Discharge status: Home / Self Care | Payer: Medicare HMO | Source: Ambulatory Visit | Attending: Family Medicine | Admitting: Family Medicine

## 2021-12-01 DIAGNOSIS — Z1231 Encounter for screening mammogram for malignant neoplasm of breast: Secondary | ICD-10-CM | POA: Insufficient documentation

## 2022-02-10 DIAGNOSIS — R739 Hyperglycemia, unspecified: Secondary | ICD-10-CM | POA: Diagnosis not present

## 2022-02-10 DIAGNOSIS — Z79899 Other long term (current) drug therapy: Secondary | ICD-10-CM | POA: Diagnosis not present

## 2022-02-10 DIAGNOSIS — E7800 Pure hypercholesterolemia, unspecified: Secondary | ICD-10-CM | POA: Diagnosis not present

## 2022-02-17 DIAGNOSIS — Z0000 Encounter for general adult medical examination without abnormal findings: Secondary | ICD-10-CM | POA: Diagnosis not present

## 2022-02-17 DIAGNOSIS — Z1331 Encounter for screening for depression: Secondary | ICD-10-CM | POA: Diagnosis not present

## 2022-10-30 ENCOUNTER — Other Ambulatory Visit: Payer: Self-pay | Admitting: Family Medicine

## 2022-10-30 DIAGNOSIS — Z1231 Encounter for screening mammogram for malignant neoplasm of breast: Secondary | ICD-10-CM

## 2022-12-06 ENCOUNTER — Ambulatory Visit
Admission: RE | Admit: 2022-12-06 | Discharge: 2022-12-06 | Disposition: A | Discharge status: Home / Self Care | Payer: Medicare PPO | Source: Ambulatory Visit | Attending: Family Medicine | Admitting: Family Medicine

## 2022-12-06 DIAGNOSIS — Z1231 Encounter for screening mammogram for malignant neoplasm of breast: Secondary | ICD-10-CM | POA: Diagnosis present

## 2022-12-11 ENCOUNTER — Other Ambulatory Visit: Payer: Self-pay | Admitting: Family Medicine

## 2022-12-11 DIAGNOSIS — R928 Other abnormal and inconclusive findings on diagnostic imaging of breast: Secondary | ICD-10-CM

## 2022-12-13 ENCOUNTER — Ambulatory Visit
Admission: RE | Admit: 2022-12-13 | Discharge: 2022-12-13 | Disposition: A | Discharge status: Home / Self Care | Payer: Medicare PPO | Source: Ambulatory Visit | Attending: Family Medicine | Admitting: Family Medicine

## 2022-12-13 DIAGNOSIS — R928 Other abnormal and inconclusive findings on diagnostic imaging of breast: Secondary | ICD-10-CM | POA: Insufficient documentation

## 2023-05-14 IMAGING — MG MM DIGITAL SCREENING BILAT W/ TOMO AND CAD
8 series · 9 of 24 positions shown · non-contrast
Comparison: Previous exam(s).

CLINICAL DATA: Screening.

EXAM:
DIGITAL SCREENING BILATERAL MAMMOGRAM WITH TOMOSYNTHESIS AND CAD
TECHNIQUE: Bilateral screening digital craniocaudal and mediolateral oblique
mammograms were obtained. Bilateral screening digital breast
tomosynthesis was performed. The images were evaluated with
computer-aided detection.

[R MLO synth-2D]
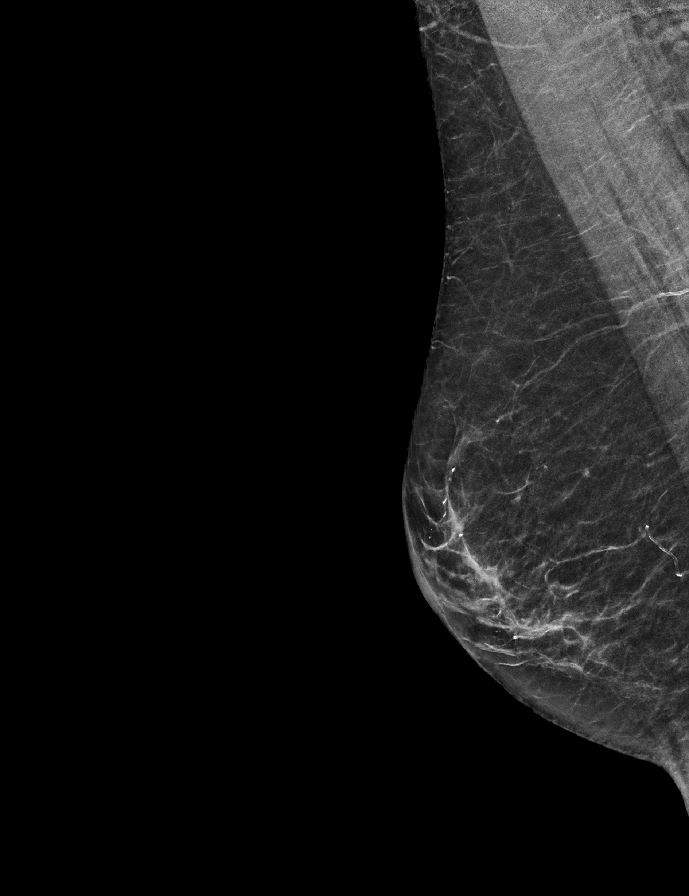

[L CC synth-2D]
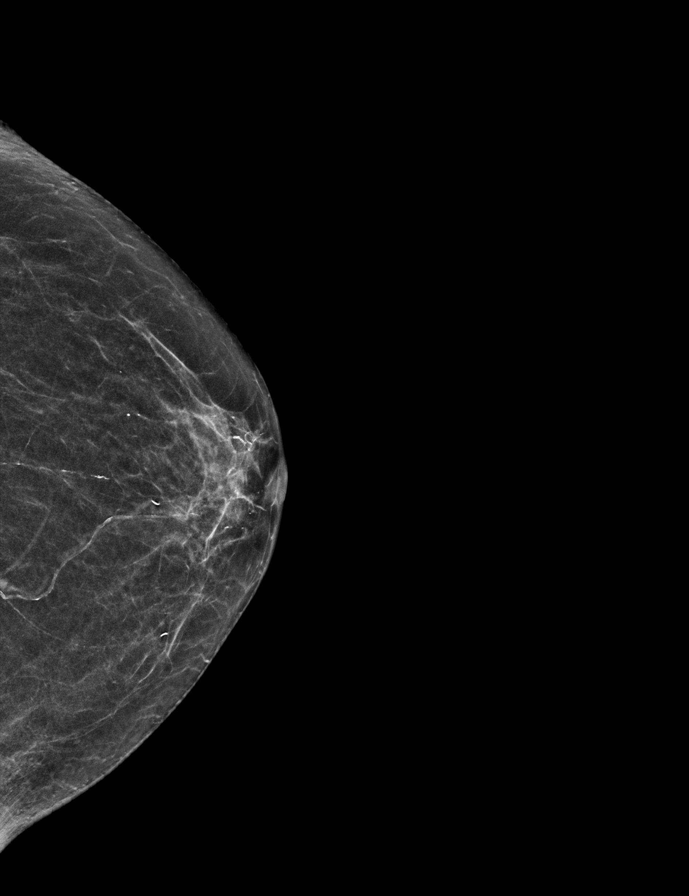

[L MLO synth-2D]
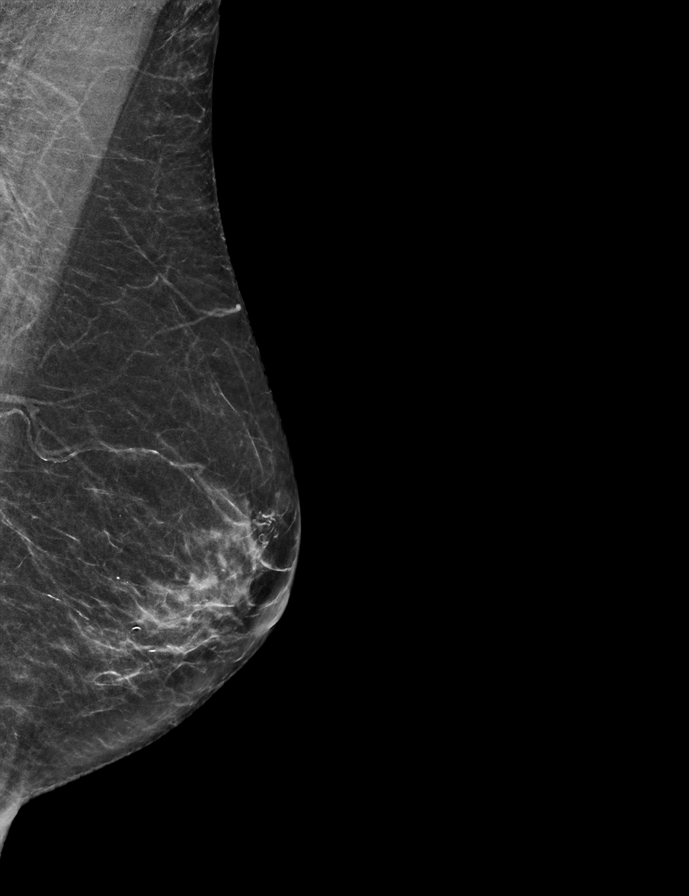

[R CC synth-2D]
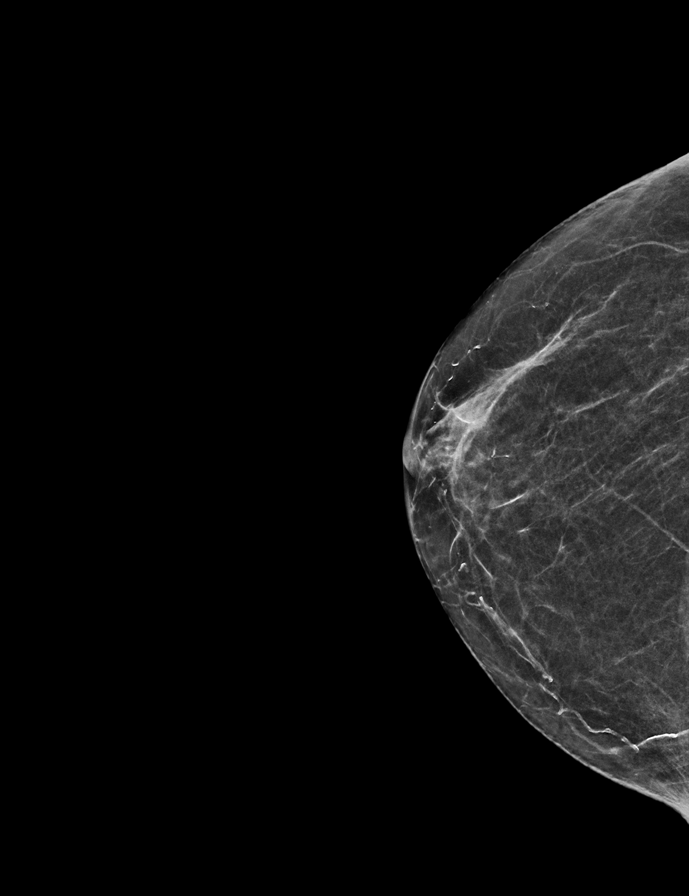

[L MLO tomo · 2 of 52 frames shown]
[frame 17/52]
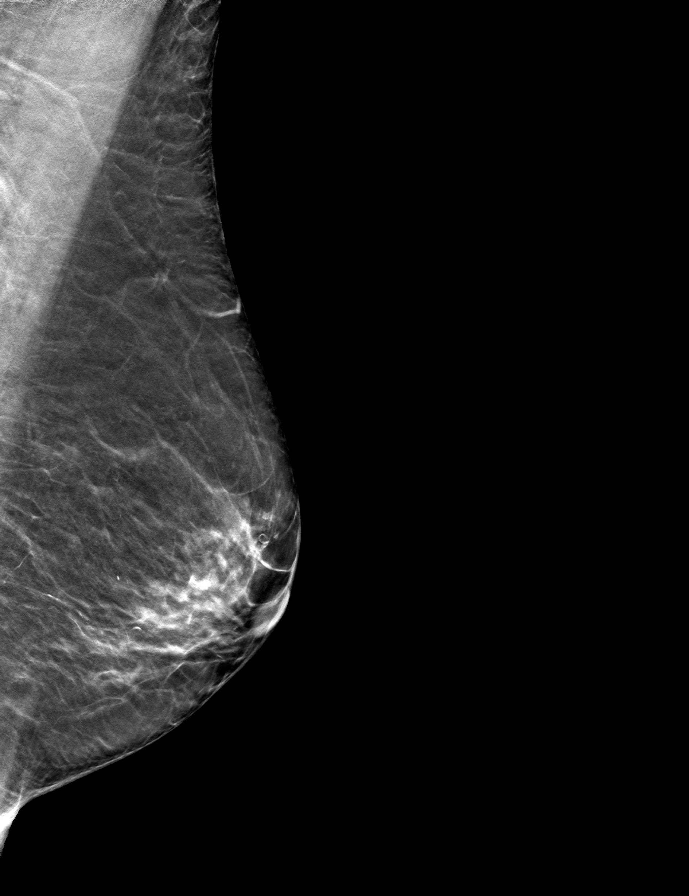
[frame 27/52]
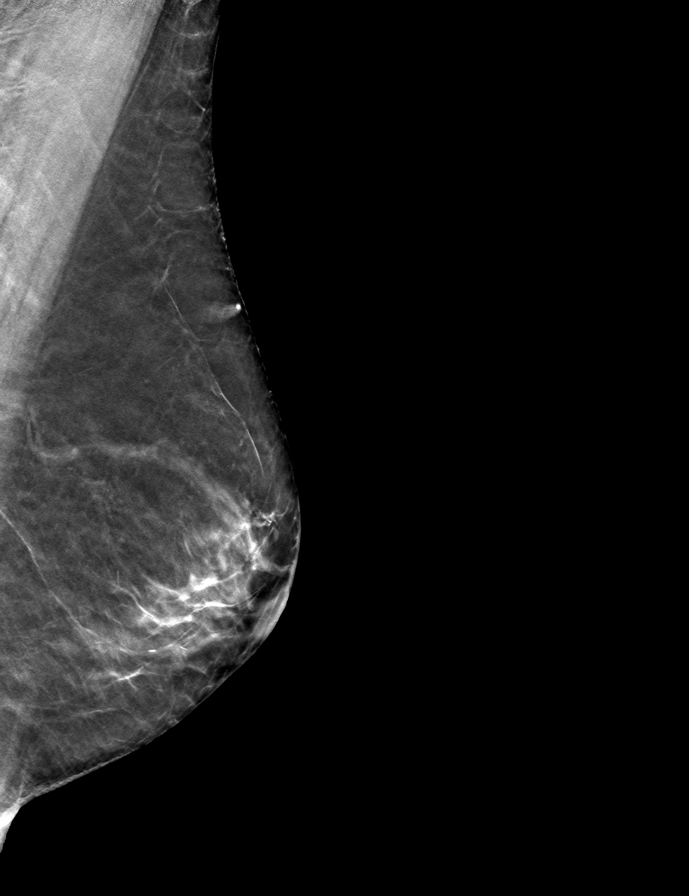

[R MLO tomo · tomo slice 26/51.0]
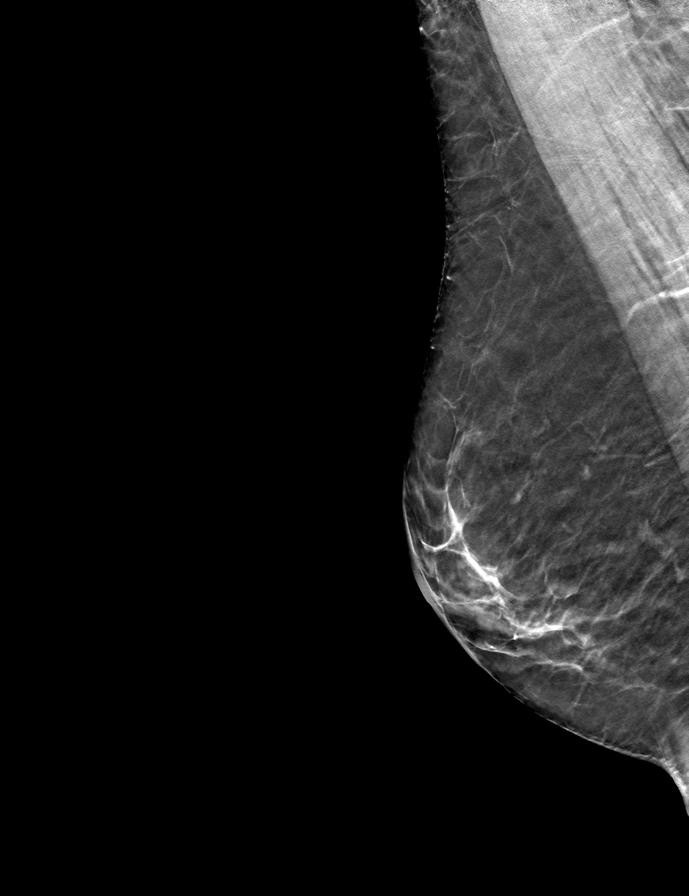

[R CC tomo · tomo slice 25/49.0]
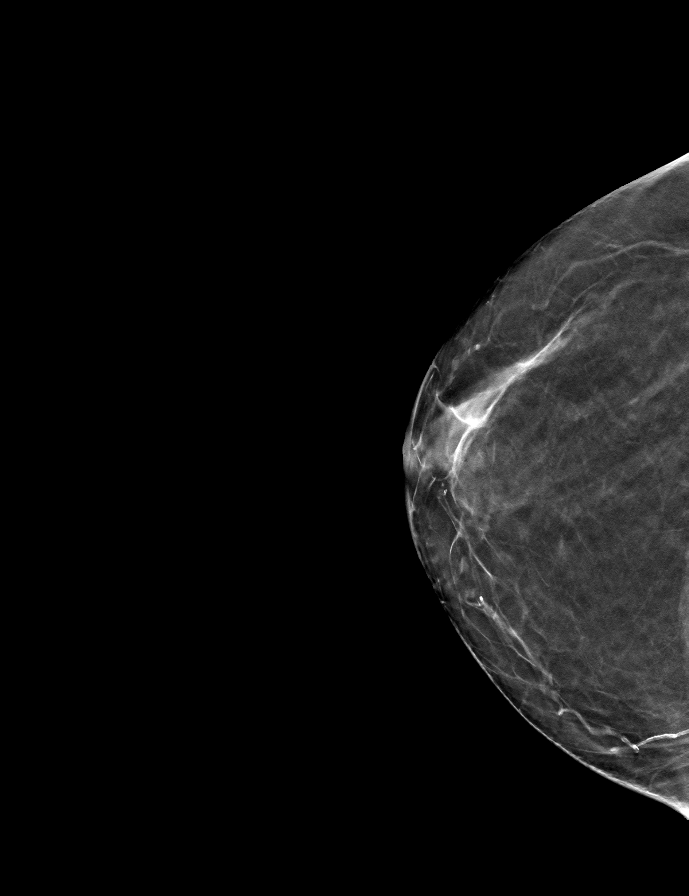

[L CC tomo · tomo slice 24/47.0]
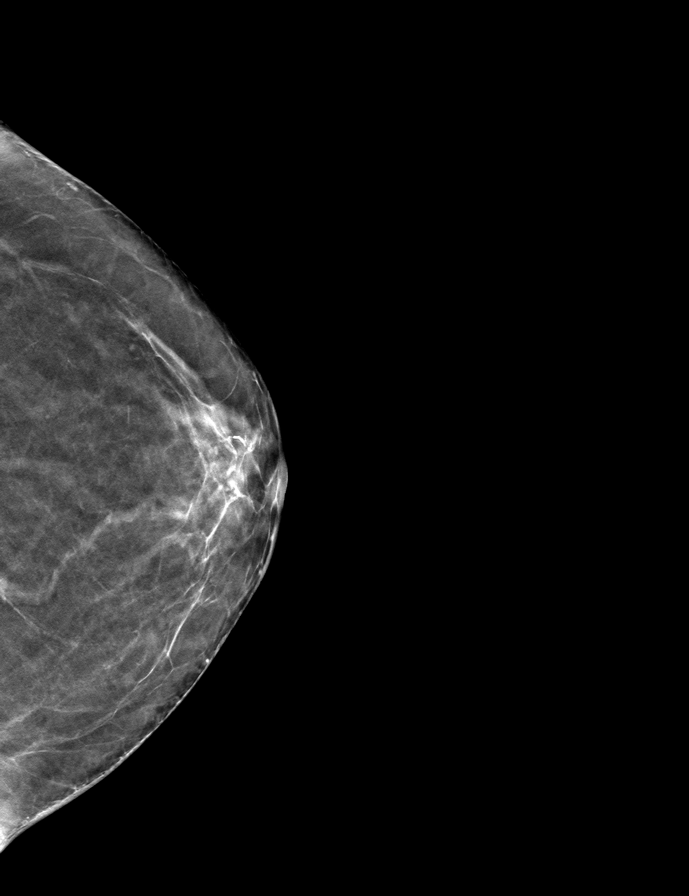

[9 of 24 positions shown; findings below may reference images not displayed]

ACR Breast Density Category b: There are scattered areas of
fibroglandular density.
FINDINGS: There are no findings suspicious for malignancy.
IMPRESSION: No mammographic evidence of malignancy. A result letter of this
screening mammogram will be mailed directly to the patient.

RECOMMENDATION:
Screening mammogram in one year. (Code:51-O-LD2)

BI-RADS CATEGORY  1: Negative.

## 2023-09-05 ENCOUNTER — Other Ambulatory Visit: Payer: Self-pay | Admitting: Physician Assistant

## 2023-09-05 DIAGNOSIS — M5416 Radiculopathy, lumbar region: Secondary | ICD-10-CM

## 2023-09-13 ENCOUNTER — Encounter: Payer: Self-pay | Admitting: Physician Assistant

## 2023-09-25 ENCOUNTER — Ambulatory Visit
Admission: RE | Admit: 2023-09-25 | Discharge: 2023-09-25 | Disposition: A | Discharge status: Home / Self Care | Payer: Medicare PPO | Source: Ambulatory Visit | Attending: Physician Assistant | Admitting: Physician Assistant

## 2023-09-25 DIAGNOSIS — M5416 Radiculopathy, lumbar region: Secondary | ICD-10-CM

## 2023-12-20 ENCOUNTER — Other Ambulatory Visit: Payer: Self-pay | Admitting: Family Medicine

## 2023-12-20 DIAGNOSIS — Z1231 Encounter for screening mammogram for malignant neoplasm of breast: Secondary | ICD-10-CM

## 2024-01-29 ENCOUNTER — Ambulatory Visit
Admission: RE | Admit: 2024-01-29 | Discharge: 2024-01-29 | Disposition: A | Discharge status: Home / Self Care | Source: Ambulatory Visit | Attending: Family Medicine | Admitting: Family Medicine

## 2024-01-29 DIAGNOSIS — Z1231 Encounter for screening mammogram for malignant neoplasm of breast: Secondary | ICD-10-CM | POA: Diagnosis present

## 2024-03-14 ENCOUNTER — Inpatient Hospital Stay
Admission: RE | Admit: 2024-03-14 | Discharge: 2024-03-14 | Disposition: A | Discharge status: Home / Self Care | Payer: Self-pay | Source: Ambulatory Visit | Attending: Neurosurgery | Admitting: Neurosurgery

## 2024-03-14 ENCOUNTER — Other Ambulatory Visit: Payer: Self-pay | Admitting: Family Medicine

## 2024-03-14 DIAGNOSIS — Z049 Encounter for examination and observation for unspecified reason: Secondary | ICD-10-CM

## 2024-03-14 NOTE — Progress Notes (Unsigned)
 Referring Physician:  Brant Caldron, FNP [address] [place],  [place] [address]  Primary Physician:  Nestor Banter, MD  History of Present Illness: 03/18/2024 Margaret Fernandez is a 73 year old female with osteoporosis who presents with leg pain and back issues.  Her symptoms started approximately 8 months ago.  She experiences leg pain primarily in the front of the thigh, accompanied by numbness, which worsens in the morning. The pain improves with movement but worsens with prolonged sitting or standing. She can walk for about 20 to 30 minutes before needing to stop due to pain. No pain in the back of the thigh is noted.  This has dramatically impacted her daily activities.  She uses ibuprofen 600 mg and a muscle relaxer as needed, though the muscle relaxer causes dizziness. She underwent two injections spaced three to four weeks apart, which did not alleviate her symptoms. She participated in physical therapy, but some exercises caused significant pain, leading her to modify her routine.  Her back issues significantly impact her ability to perform daily activities such as mowing the lawn, cleaning, and gardening. She has to break tasks into smaller parts and rest in between due to pain.  Low back pain that goes into the left thigh, doesn't go past the knee.  Bowel/Bladder Dysfunction: none  Conservative measures:  Physical therapy: has participated in 2 visits at [facility] Multimodal medical therapy including regular antiinflammatories:  tramadol , prednisone, tizanidine, ibuprofen Injections: 12/25/2023: Left L3-4 transforaminal ESI (55% relief) 11/09/2023: Left L3-4 transforaminal ESI (2 weeks of good relief and then gradual return of pain.)   Past Surgery: no spinal surgeries  Margaret Fernandez has no symptoms of cervical myelopathy.  The symptoms are causing a significant impact on the patient's life.   I have utilized the care everywhere function in epic to  review the outside records available from external health systems.  Review of Systems:  A 10 point review of systems is negative, except for the pertinent positives and negatives detailed in the HPI.  Past Medical History: Past Medical History:  Diagnosis Date   Anxiety    Chronic back pain    Colon polyp    GERD (gastroesophageal reflux disease)    Hyperlipidemia    Hypertension    Mixed incontinence    Osteoporosis     Past Surgical History: Past Surgical History:  Procedure Laterality Date   bone graph     left wrist   BREAST BIOPSY Left 20 yrs ago   needle bx   CHOLECYSTECTOMY  2002   FOOT ARTHRODESIS, MODIFIED MCBRIDE     Both feet   FOOT SURGERY     HYSTEROSCOPY WITH D & C     NISSEN FUNDOPLICATION  2001   WRIST SURGERY  2006    Allergies: Allergies as of 03/18/2024 - Review Complete 03/18/2024  Allergen Reaction Noted   Penicillins Itching and Swelling 11/23/2014   Augmentin [amoxicillin-pot clavulanate] Itching and Swelling 11/23/2014    Medications:  Current Outpatient Medications:    amitriptyline  (ELAVIL ) 50 MG tablet, Take 1 tablet (50 mg total) by mouth at bedtime., Disp: 90 tablet, Rfl: 3   amLODipine  (NORVASC ) 5 MG tablet, TAKE 1 TABLET BY MOUTH  DAILY, Disp: 90 tablet, Rfl: 3   aspirin 81 MG tablet, Take 81 mg by mouth daily., Disp: , Rfl:    atorvastatin  (LIPITOR) 40 MG tablet, Take 1 tablet (40 mg total) by mouth daily., Disp: 90 tablet, Rfl: 0  buPROPion  (WELLBUTRIN  XL) 150 MG 24 hr tablet, Take 1 tablet (150 mg total) by mouth daily. (Patient not taking: Reported on 01/15/2018), Disp: 11 tablet, Rfl: 0   Calcium  Carb-Cholecalciferol (CALCIUM  600 + D PO), Take 1 tablet by mouth 2 (two) times daily., Disp: , Rfl:    cholecalciferol (VITAMIN D) 1000 UNITS tablet, Take 1,000 Units by mouth daily., Disp: , Rfl:    fenofibrate  (TRICOR ) 145 MG tablet, TAKE 1 TABLET BY MOUTH  DAILY, Disp: 90 tablet, Rfl: 3   Lactobacillus (ACIDOPHILUS) CAPS capsule,  Take by mouth., Disp: , Rfl:    Multiple Vitamin (MULTIVITAMIN) tablet, Take 1 tablet by mouth daily., Disp: , Rfl:    omeprazole  (PRILOSEC) 40 MG capsule, TAKE 1 CAPSULE BY MOUTH  DAILY, Disp: 90 capsule, Rfl: 1   traMADol -acetaminophen  (ULTRACET ) 37.5-325 MG tablet, TAKE ONE TABLET BY MOUTH EVERY 8 HOURS AS NEEDED, Disp: 60 tablet, Rfl: 0  Social History: Social History   Tobacco Use   Smoking status: Former    Current packs/day: 0.00    Types: Cigarettes    Quit date: 10/23/2013    Years since quitting: 10.4   Smokeless tobacco: Never  Substance Use Topics   Alcohol use: Yes    Alcohol/week: 1.0 standard drink of alcohol    Types: 1 Glasses of wine per week   Drug use: No    Family Medical History: Family History  Problem Relation Age of Onset   Cancer Mother        ovarian cancer   Hypertension Mother    Heart disease Mother    Cancer Father        colon cancer   Diabetes Neg Hx    Breast cancer Neg Hx     Physical Examination: Vitals:   03/18/24 1305  BP: 128/80    General: Patient is in no apparent distress. Attention to examination is appropriate.  Neck:   Supple.  Full range of motion.  Respiratory: Patient is breathing without any difficulty.   NEUROLOGICAL:     Awake, alert, oriented to person, place, and time.  Speech is clear and fluent.   Cranial Nerves: Pupils equal round and reactive to light.  Facial tone is symmetric.  Facial sensation is symmetric. Shoulder shrug is symmetric. Tongue protrusion is midline.  There is no pronator drift.  Strength: Side Biceps Triceps Deltoid Interossei Grip Wrist Ext. Wrist Flex.  R 5 5 5 5 5 5 5   L 5 5 5 5 5 5 5    Side Iliopsoas Quads Hamstring PF DF EHL  R 5 5 5 5 5 5   L 5 5 5 5 5 5    Reflexes are 1+ and symmetric at the biceps, triceps, brachioradialis, patella and achilles.   Hoffman's is absent.   Bilateral upper and lower extremity sensation is intact to light touch with exception of left anterior  thigh which is decreased to light touch.    No evidence of dysmetria noted.  Gait is normal.     Medical Decision Making  Imaging: MRI L spine 10/08/2023 IMPRESSION: 1. S shaped scoliosis. 2. L2-3: Severe multifactorial stenosis of the central canal and lateral recesses. Severe left foraminal stenosis due to encroachment by osteophytes and disc material that would likely focally affect the left L2 nerve in addition. 3. L3-4: Endplate osteophytes and shallow broad-based disc herniation. Facet and ligamentous hypertrophy. Stenosis of the lateral recesses and foramina on both sides that could cause neural compression. 4. L4-5: Chronic facet arthropathy with  11 mm anterolisthesis. Disc degeneration with pseudo disc herniation. Moderate stenosis of the central canal. Severe stenosis of the lateral recesses and neural foramina, right worse than left. 5. L5-S1: Endplate osteophytes and bulging of the disc. Mild bilateral facet osteoarthritis. No compressive stenosis. 6. Discogenic marrow edema at L2-3, L3-4 and L4-5 which would likely relate to regional pain.     Electronically Signed   By: Bettylou Brunner M.D.   On: 10/08/2023 15:48  Xrays L spine 07/30/2023 11.8 mm L4 on L5 anterolisthesis 14 degree coronal plan curve T12-L5   I have personally reviewed the images and agree with the above interpretation.  Assessment and Plan: Ms. Simmonds is a pleasant 73 y.o. female with severe degenerative disc disease with lumbar stenosis between L2 and L5.  She has severe stenosis at L4-5 and L2-3.  She has stenosis at L3-4 as well.  She has acquired scoliosis with a coronal plane deformity.  This is causing severe low back pain with left-sided sciatica.  She has started physical therapy.  I recommended that she continue physical therapy.  If she does not make continued does not make continued improvements with physical therapy, she may be a candidate for L2-5 lateral lumbar interbody fusion with  percutaneous fixation.  She has history of osteoporosis.  She has not been checked in several years.  I have recommended rechecking her bone density.  I will see her back in 6 weeks after she finishes physical therapy.  I spent a total of 30 minutes in this patient's care today. This time was spent reviewing pertinent records including imaging studies, obtaining and confirming history, performing a directed evaluation, formulating and discussing my recommendations, and documenting the visit within the medical record.    Thank you for involving me in the care of this patient.      Tyleek Smick K. Mont Antis MD, [facility]

## 2024-03-18 ENCOUNTER — Ambulatory Visit (INDEPENDENT_AMBULATORY_CARE_PROVIDER_SITE_OTHER): Admitting: Neurosurgery

## 2024-03-18 VITALS — BP 128/80 | Ht <= 58 in | Wt 111.0 lb

## 2024-03-18 DIAGNOSIS — M419 Scoliosis, unspecified: Secondary | ICD-10-CM

## 2024-03-18 DIAGNOSIS — M51362 Other intervertebral disc degeneration, lumbar region with discogenic back pain and lower extremity pain: Secondary | ICD-10-CM | POA: Diagnosis not present

## 2024-03-18 DIAGNOSIS — M810 Age-related osteoporosis without current pathological fracture: Secondary | ICD-10-CM

## 2024-03-18 DIAGNOSIS — M48062 Spinal stenosis, lumbar region with neurogenic claudication: Secondary | ICD-10-CM | POA: Diagnosis not present

## 2024-03-18 DIAGNOSIS — M5416 Radiculopathy, lumbar region: Secondary | ICD-10-CM

## 2024-03-18 DIAGNOSIS — G8929 Other chronic pain: Secondary | ICD-10-CM

## 2024-03-18 DIAGNOSIS — M4316 Spondylolisthesis, lumbar region: Secondary | ICD-10-CM

## 2024-05-13 ENCOUNTER — Encounter: Payer: Self-pay | Admitting: Neurosurgery

## 2024-05-13 ENCOUNTER — Ambulatory Visit: Admitting: Neurosurgery

## 2024-05-13 VITALS — BP 144/84 | Ht <= 58 in | Wt 111.0 lb

## 2024-05-13 DIAGNOSIS — G8929 Other chronic pain: Secondary | ICD-10-CM

## 2024-05-13 DIAGNOSIS — M51362 Other intervertebral disc degeneration, lumbar region with discogenic back pain and lower extremity pain: Secondary | ICD-10-CM | POA: Diagnosis not present

## 2024-05-13 DIAGNOSIS — M48062 Spinal stenosis, lumbar region with neurogenic claudication: Secondary | ICD-10-CM | POA: Diagnosis not present

## 2024-05-13 DIAGNOSIS — M4316 Spondylolisthesis, lumbar region: Secondary | ICD-10-CM

## 2024-05-13 DIAGNOSIS — M419 Scoliosis, unspecified: Secondary | ICD-10-CM

## 2024-05-13 NOTE — Progress Notes (Signed)
 Referring Physician:  Diedra Lame, MD [phone] [address] [facility] [place],  [place] [address]  Primary Physician:  Diedra Lame, MD  History of Present Illness: 05/13/2024 She is doing significantly better.  She is not ready to move forward with any additional interventions.   03/18/2024 Margaret Fernandez is a 73 year old female with osteoporosis who presents with leg pain and back issues.  Her symptoms started approximately 8 months ago.  She experiences leg pain primarily in the front of the thigh, accompanied by numbness, which worsens in the morning. The pain improves with movement but worsens with prolonged sitting or standing. She can walk for about 20 to 30 minutes before needing to stop due to pain. No pain in the back of the thigh is noted.  This has dramatically impacted her daily activities.  She uses ibuprofen 600 mg and a muscle relaxer as needed, though the muscle relaxer causes dizziness. She underwent two injections spaced three to four weeks apart, which did not alleviate her symptoms. She participated in physical therapy, but some exercises caused significant pain, leading her to modify her routine.  Her back issues significantly impact her ability to perform daily activities such as mowing the lawn, cleaning, and gardening. She has to break tasks into smaller parts and rest in between due to pain.  Low back pain that goes into the left thigh, doesn't go past the knee.  Bowel/Bladder Dysfunction: none  Conservative measures:  Physical therapy: has participated in 2 visits at [facility] Multimodal medical therapy including regular antiinflammatories:  tramadol , prednisone, tizanidine, ibuprofen Injections: 12/25/2023: Left L3-4 transforaminal ESI (55% relief) 11/09/2023: Left L3-4 transforaminal ESI (2 weeks of good relief and then gradual return of pain.)   Past Surgery: no spinal surgeries  Margaret Hadassah Orange has no  symptoms of cervical myelopathy.  The symptoms are causing a significant impact on the patient's life.   I have utilized the care everywhere function in epic to review the outside records available from external health systems.  Review of Systems:  A 10 point review of systems is negative, except for the pertinent positives and negatives detailed in the HPI.  Past Medical History: Past Medical History:  Diagnosis Date   Anxiety    Chronic back pain    Colon polyp    GERD (gastroesophageal reflux disease)    Hyperlipidemia    Hypertension    Mixed incontinence    Osteoporosis     Past Surgical History: Past Surgical History:  Procedure Laterality Date   bone graph     left wrist   BREAST BIOPSY Left 20 yrs ago   needle bx   CHOLECYSTECTOMY  2002   FOOT ARTHRODESIS, MODIFIED MCBRIDE     Both feet   FOOT SURGERY     HYSTEROSCOPY WITH D & C     NISSEN FUNDOPLICATION  2001   WRIST SURGERY  2006    Allergies: Allergies as of 05/13/2024 - Review Complete 05/13/2024  Allergen Reaction Noted   Penicillins Itching and Swelling 11/23/2014   Augmentin [amoxicillin-pot clavulanate] Itching and Swelling 11/23/2014    Medications:  Current Outpatient Medications:    amitriptyline  (ELAVIL ) 50 MG tablet, Take 1 tablet (50 mg total) by mouth at bedtime., Disp: 90 tablet, Rfl: 3   amLODipine  (NORVASC ) 5 MG tablet, TAKE 1 TABLET BY MOUTH  DAILY, Disp: 90 tablet, Rfl: 3   aspirin 81 MG tablet, Take 81 mg by mouth  daily., Disp: , Rfl:    atorvastatin  (LIPITOR) 40 MG tablet, Take 1 tablet (40 mg total) by mouth daily., Disp: 90 tablet, Rfl: 0   buPROPion  (WELLBUTRIN  XL) 150 MG 24 hr tablet, Take 1 tablet (150 mg total) by mouth daily., Disp: 11 tablet, Rfl: 0   cholecalciferol (VITAMIN D) 1000 UNITS tablet, Take 1,000 Units by mouth daily., Disp: , Rfl:    fenofibrate  (TRICOR ) 145 MG tablet, TAKE 1 TABLET BY MOUTH  DAILY, Disp: 90 tablet, Rfl: 3   omeprazole  (PRILOSEC) 40 MG capsule,  TAKE 1 CAPSULE BY MOUTH  DAILY, Disp: 90 capsule, Rfl: 1   traMADol  (ULTRAM ) 50 MG tablet, Take 50 mg by mouth., Disp: , Rfl:   Social History: Social History   Tobacco Use   Smoking status: Former    Current packs/day: 0.00    Types: Cigarettes    Quit date: 10/23/2013    Years since quitting: 10.5   Smokeless tobacco: Never  Substance Use Topics   Alcohol use: Yes    Alcohol/week: 1.0 standard drink of alcohol    Types: 1 Glasses of wine per week   Drug use: No    Family Medical History: Family History  Problem Relation Age of Onset   Cancer Mother        ovarian cancer   Hypertension Mother    Heart disease Mother    Cancer Father        colon cancer   Diabetes Neg Hx    Breast cancer Neg Hx     Physical Examination: Vitals:   05/13/24 1444  BP: (!) 144/84    General: Patient is in no apparent distress. Attention to examination is appropriate.  Neck:   Supple.  Full range of motion.  Respiratory: Patient is breathing without any difficulty.   NEUROLOGICAL:     Awake, alert, oriented to person, place, and time.  Speech is clear and fluent.   Cranial Nerves: Pupils equal round and reactive to light.  Facial tone is symmetric.  Facial sensation is symmetric. Shoulder shrug is symmetric. Tongue protrusion is midline.  There is no pronator drift.  Strength: Side Biceps Triceps Deltoid Interossei Grip Wrist Ext. Wrist Flex.  R 5 5 5 5 5 5 5   L 5 5 5 5 5 5 5    Side Iliopsoas Quads Hamstring PF DF EHL  R 5 5 5 5 5 5   L 5 5 5 5 5 5    Reflexes are 1+ and symmetric at the biceps, triceps, brachioradialis, patella and achilles.   Hoffman's is absent.   Bilateral upper and lower extremity sensation is intact to light touch with exception of left anterior thigh which is decreased to light touch.    No evidence of dysmetria noted.  Gait is normal.     Medical Decision Making  Imaging: MRI L spine 10/08/2023 IMPRESSION: 1. S shaped scoliosis. 2. L2-3:  Severe multifactorial stenosis of the central canal and lateral recesses. Severe left foraminal stenosis due to encroachment by osteophytes and disc material that would likely focally affect the left L2 nerve in addition. 3. L3-4: Endplate osteophytes and shallow broad-based disc herniation. Facet and ligamentous hypertrophy. Stenosis of the lateral recesses and foramina on both sides that could cause neural compression. 4. L4-5: Chronic facet arthropathy with 11 mm anterolisthesis. Disc degeneration with pseudo disc herniation. Moderate stenosis of the central canal. Severe stenosis of the lateral recesses and neural foramina, right worse than left. 5. L5-S1: Endplate osteophytes and bulging  of the disc. Mild bilateral facet osteoarthritis. No compressive stenosis. 6. Discogenic marrow edema at L2-3, L3-4 and L4-5 which would likely relate to regional pain.     Electronically Signed   By: Oneil Officer M.D.   On: 10/08/2023 15:48  Xrays L spine 07/30/2023 11.8 mm L4 on L5 anterolisthesis 14 degree coronal plan curve T12-L5   I have personally reviewed the images and agree with the above interpretation.  Assessment and Plan: Ms. Dejonge is a pleasant 73 y.o. female with severe degenerative disc disease with lumbar stenosis between L2 and L5.  She has severe stenosis at L4-5 and L2-3.  She has stenosis at L3-4 as well.  She has acquired scoliosis with a coronal plane deformity.  This is causing severe low back pain with left-sided sciatica, though her symptoms are currently manageable.  We will plan to continue non-operative management for now and will see her back in 3-4 months.  I spent a total of 10 minutes in this patient's care today. This time was spent reviewing pertinent records including imaging studies, obtaining and confirming history, performing a directed evaluation, formulating and discussing my recommendations, and documenting the visit within the medical record.     Thank you for involving me in the care of this patient.      Kamelia Lampkins K. Clois MD, [facility]surgery

## 2024-06-02 DIAGNOSIS — H25813 Combined forms of age-related cataract, bilateral: Secondary | ICD-10-CM | POA: Diagnosis not present

## 2024-08-20 DIAGNOSIS — R1319 Other dysphagia: Secondary | ICD-10-CM | POA: Diagnosis not present

## 2024-08-20 DIAGNOSIS — K2270 Barrett's esophagus without dysplasia: Secondary | ICD-10-CM | POA: Diagnosis not present

## 2024-08-20 DIAGNOSIS — K219 Gastro-esophageal reflux disease without esophagitis: Secondary | ICD-10-CM | POA: Diagnosis not present

## 2024-08-28 ENCOUNTER — Ambulatory Visit: Admitting: Neurosurgery

## 2024-08-28 DIAGNOSIS — R7303 Prediabetes: Secondary | ICD-10-CM | POA: Diagnosis not present

## 2024-08-28 DIAGNOSIS — Z79899 Other long term (current) drug therapy: Secondary | ICD-10-CM | POA: Diagnosis not present

## 2024-08-28 DIAGNOSIS — E7800 Pure hypercholesterolemia, unspecified: Secondary | ICD-10-CM | POA: Diagnosis not present

## 2024-09-04 DIAGNOSIS — M549 Dorsalgia, unspecified: Secondary | ICD-10-CM | POA: Diagnosis not present

## 2024-09-04 DIAGNOSIS — I10 Essential (primary) hypertension: Secondary | ICD-10-CM | POA: Diagnosis not present

## 2024-09-04 DIAGNOSIS — R7303 Prediabetes: Secondary | ICD-10-CM | POA: Diagnosis not present

## 2024-09-04 DIAGNOSIS — Z23 Encounter for immunization: Secondary | ICD-10-CM | POA: Diagnosis not present

## 2024-09-04 DIAGNOSIS — G8929 Other chronic pain: Secondary | ICD-10-CM | POA: Diagnosis not present

## 2024-09-04 DIAGNOSIS — Z79899 Other long term (current) drug therapy: Secondary | ICD-10-CM | POA: Diagnosis not present

## 2024-09-04 DIAGNOSIS — E785 Hyperlipidemia, unspecified: Secondary | ICD-10-CM | POA: Diagnosis not present

## 2024-12-12 ENCOUNTER — Encounter
